# Patient Record
Sex: Female | Born: 1955
Health system: Southern US, Community
[De-identification: ages and names within clinical notes are randomized; demographics above are authoritative.]

## PROBLEM LIST (undated history)

## (undated) DIAGNOSIS — M2550 Pain in unspecified joint: Secondary | ICD-10-CM

## (undated) DIAGNOSIS — K219 Gastro-esophageal reflux disease without esophagitis: Secondary | ICD-10-CM

## (undated) DIAGNOSIS — E7800 Pure hypercholesterolemia, unspecified: Secondary | ICD-10-CM

## (undated) DIAGNOSIS — J309 Allergic rhinitis, unspecified: Secondary | ICD-10-CM

## (undated) DIAGNOSIS — C439 Malignant melanoma of skin, unspecified: Secondary | ICD-10-CM

## (undated) DIAGNOSIS — E669 Obesity, unspecified: Secondary | ICD-10-CM

## (undated) DIAGNOSIS — R5382 Chronic fatigue, unspecified: Secondary | ICD-10-CM

## (undated) DIAGNOSIS — R0602 Shortness of breath: Secondary | ICD-10-CM

## (undated) DIAGNOSIS — E079 Disorder of thyroid, unspecified: Secondary | ICD-10-CM

## (undated) DIAGNOSIS — D351 Benign neoplasm of parathyroid gland: Secondary | ICD-10-CM

## (undated) DIAGNOSIS — E785 Hyperlipidemia, unspecified: Secondary | ICD-10-CM

## (undated) DIAGNOSIS — I5189 Other ill-defined heart diseases: Secondary | ICD-10-CM

## (undated) DIAGNOSIS — Q828 Other specified congenital malformations of skin: Secondary | ICD-10-CM

## (undated) HISTORY — DX: Hyperlipidemia, unspecified: E78.5

## (undated) HISTORY — DX: Pure hypercholesterolemia, unspecified: E78.00

## (undated) HISTORY — DX: Allergic rhinitis, unspecified: J30.9

## (undated) HISTORY — PX: OTHER SURGICAL HISTORY: SHX169

## (undated) HISTORY — PX: ABDOMINAL HYSTERECTOMY: SHX81

## (undated) HISTORY — DX: Shortness of breath: R06.02

## (undated) HISTORY — DX: Gastro-esophageal reflux disease without esophagitis: K21.9

## (undated) HISTORY — DX: Chronic fatigue, unspecified: R53.82

## (undated) HISTORY — DX: Benign neoplasm of parathyroid gland: D35.1

## (undated) HISTORY — DX: Other ill-defined heart diseases: I51.89

## (undated) HISTORY — DX: Obesity, unspecified: E66.9

## (undated) HISTORY — DX: Pain in unspecified joint: M25.50

## (undated) HISTORY — PX: SINUS SURGERY WITH INSTATRAK: SHX5215

## (undated) HISTORY — DX: Other specified congenital malformations of skin: Q82.8

## (undated) HISTORY — PX: BREAST CYST ASPIRATION: SHX578

---

## 2004-07-29 ENCOUNTER — Ambulatory Visit: Payer: Self-pay

## 2005-08-01 ENCOUNTER — Ambulatory Visit: Payer: Self-pay

## 2006-08-03 ENCOUNTER — Ambulatory Visit: Payer: Self-pay

## 2007-10-23 ENCOUNTER — Ambulatory Visit: Payer: Self-pay

## 2008-10-28 ENCOUNTER — Ambulatory Visit: Payer: Self-pay

## 2009-11-02 ENCOUNTER — Ambulatory Visit: Payer: Self-pay | Admitting: Gastroenterology

## 2009-11-02 HISTORY — PX: COLONOSCOPY: SHX174

## 2009-11-02 LAB — HM COLONOSCOPY

## 2009-11-17 ENCOUNTER — Ambulatory Visit: Payer: Self-pay

## 2010-11-18 ENCOUNTER — Ambulatory Visit: Payer: Self-pay

## 2011-08-12 ENCOUNTER — Ambulatory Visit: Payer: Self-pay | Admitting: Otolaryngology

## 2012-01-24 ENCOUNTER — Ambulatory Visit: Payer: Self-pay

## 2013-02-05 ENCOUNTER — Ambulatory Visit: Payer: Self-pay

## 2014-02-19 DIAGNOSIS — E7800 Pure hypercholesterolemia, unspecified: Secondary | ICD-10-CM | POA: Insufficient documentation

## 2014-02-19 HISTORY — DX: Pure hypercholesterolemia, unspecified: E78.00

## 2014-03-05 ENCOUNTER — Ambulatory Visit: Payer: Self-pay

## 2014-10-24 DIAGNOSIS — C439 Malignant melanoma of skin, unspecified: Secondary | ICD-10-CM

## 2014-10-24 HISTORY — DX: Malignant melanoma of skin, unspecified: C43.9

## 2016-01-25 ENCOUNTER — Other Ambulatory Visit: Payer: Self-pay | Admitting: Family Medicine

## 2016-01-25 DIAGNOSIS — R918 Other nonspecific abnormal finding of lung field: Secondary | ICD-10-CM

## 2016-01-25 DIAGNOSIS — R05 Cough: Secondary | ICD-10-CM

## 2016-01-25 DIAGNOSIS — R053 Chronic cough: Secondary | ICD-10-CM

## 2016-01-28 ENCOUNTER — Ambulatory Visit
Admission: RE | Admit: 2016-01-28 | Discharge: 2016-01-28 | Disposition: A | Discharge status: Home / Self Care | Payer: 59 | Source: Ambulatory Visit | Attending: Family Medicine | Admitting: Family Medicine

## 2016-01-28 DIAGNOSIS — R053 Chronic cough: Secondary | ICD-10-CM

## 2016-01-28 DIAGNOSIS — R05 Cough: Secondary | ICD-10-CM | POA: Diagnosis present

## 2016-01-28 DIAGNOSIS — R918 Other nonspecific abnormal finding of lung field: Secondary | ICD-10-CM | POA: Diagnosis present

## 2016-02-02 ENCOUNTER — Institutional Professional Consult (permissible substitution): Payer: 59 | Admitting: Internal Medicine

## 2016-02-03 ENCOUNTER — Ambulatory Visit (INDEPENDENT_AMBULATORY_CARE_PROVIDER_SITE_OTHER): Payer: 59 | Admitting: Internal Medicine

## 2016-02-03 ENCOUNTER — Encounter: Payer: Self-pay | Admitting: Internal Medicine

## 2016-02-03 VITALS — BP 124/72 | HR 74 | Ht 64.0 in | Wt 212.0 lb

## 2016-02-03 DIAGNOSIS — R0600 Dyspnea, unspecified: Secondary | ICD-10-CM | POA: Diagnosis not present

## 2016-02-03 DIAGNOSIS — R609 Edema, unspecified: Secondary | ICD-10-CM | POA: Diagnosis not present

## 2016-02-03 DIAGNOSIS — R05 Cough: Secondary | ICD-10-CM | POA: Insufficient documentation

## 2016-02-03 DIAGNOSIS — R059 Cough, unspecified: Secondary | ICD-10-CM | POA: Insufficient documentation

## 2016-02-03 DIAGNOSIS — R600 Localized edema: Secondary | ICD-10-CM

## 2016-02-03 DIAGNOSIS — J309 Allergic rhinitis, unspecified: Secondary | ICD-10-CM

## 2016-02-03 DIAGNOSIS — K219 Gastro-esophageal reflux disease without esophagitis: Secondary | ICD-10-CM

## 2016-02-03 MED ORDER — ALBUTEROL SULFATE HFA 108 (90 BASE) MCG/ACT IN AERS: 2.0000 | INHALATION_SPRAY | RESPIRATORY_TRACT | Status: DC | PRN

## 2016-02-03 NOTE — Patient Instructions (Signed)

## 2016-02-03 NOTE — Progress Notes (Signed)
[facility] Consultation      Date: 02/03/2016,   MRN# [id number] Diane Vasquez [birth year] Code Status:  Code Status History    This patient does not have a recorded code status. Please follow your organizational policy for patients in this situation.     Hosp day:@LENGTHOFSTAYDAYS @ Referring MD: @ATDPROV @     PCP:      AdmissionWeight: 212 lb (96.163 kg)                 CurrentWeight: 212 lb (96.163 kg) Diane Vasquez is a 60 y.o. old female seen in consultation for COUGH at the request of Dr. Kandice Robinsons     CHIEF COMPLAINT:  Cough for months    HISTORY OF PRESENT ILLNESS  60 yo pleasant white female seen today for evaluation for cough she has been coughing for 4 months, she had cold like symptoms in end of November and has been coughing since then daily for last 4 months It is a nonproductive cough, associated with occasional SOB, also has fatigue and generalized weakness  She had CT chest which does not show any impressive changes beside some slight atalectasis  She has a Rash that is being treated by Derm with Dapsone daily for last 3 months-rash has disappeared since therapy Was under breasts and groin area  She has occasional hand and feet swelling when she wakes up     PAST MEDICAL HISTORY   Past Medical History  Diagnosis Date  . Allergic rhinitis   . Hyperlipidemia   . GERD (gastroesophageal reflux disease)      SURGICAL HISTORY   Past Surgical History  Procedure Laterality Date  . Sinus surgery with instatrak    . Abdominal hysterectomy       FAMILY HISTORY   Family History  Problem Relation Age of Onset  . Heart attack Mother   . Heart attack Maternal Grandfather      SOCIAL HISTORY   Social History  Substance Use Topics  . Smoking status: Never Smoker   . Smokeless tobacco: None  . Alcohol Use: Yes     Comment: occ     MEDICATIONS    Home Medication:  Current Outpatient Rx  Name  Route  Sig  Dispense   Refill  . cetirizine-pseudoephedrine (ZYRTEC-D) 5-120 MG tablet   Oral   Take 1 tablet by mouth daily.         . dapsone 100 MG tablet   Oral   Take by mouth. Pt taking 0.5 tab         . folic acid (FOLVITE) 1 MG tablet               . Ivermectin (SOOLANTRA) 1 % CREA               . omeprazole (PRILOSEC) 20 MG capsule   Oral   Take by mouth.         . tretinoin (RETIN-A) 0.025 % cream   Topical   Apply topically.         Marland Kitchen albuterol (PROVENTIL HFA;VENTOLIN HFA) 108 (90 Base) MCG/ACT inhaler   Inhalation   Inhale 2 puffs into the lungs every 4 (four) hours as needed for wheezing or shortness of breath.   1 Inhaler   10     Current Medication:  Current outpatient prescriptions:  .  cetirizine-pseudoephedrine (ZYRTEC-D) 5-120 MG tablet, Take 1 tablet by mouth daily., Disp: , Rfl:  .  dapsone 100 MG tablet,  Take by mouth. Pt taking 0.5 tab, Disp: , Rfl:  .  folic acid (FOLVITE) 1 MG tablet, , Disp: , Rfl:  .  Ivermectin (SOOLANTRA) 1 % CREA, , Disp: , Rfl:  .  omeprazole (PRILOSEC) 20 MG capsule, Take by mouth., Disp: , Rfl:  .  tretinoin (RETIN-A) 0.025 % cream, Apply topically., Disp: , Rfl:  .  albuterol (PROVENTIL HFA;VENTOLIN HFA) 108 (90 Base) MCG/ACT inhaler, Inhale 2 puffs into the lungs every 4 (four) hours as needed for wheezing or shortness of breath., Disp: 1 Inhaler, Rfl: 10    ALLERGIES   Tramadol     REVIEW OF SYSTEMS   Review of Systems  Constitutional: Negative for fever, chills, weight loss and malaise/fatigue.  HENT: Positive for congestion. Negative for hearing loss.   Eyes: Negative for blurred vision and double vision.  Respiratory: Positive for cough and shortness of breath. Negative for hemoptysis, sputum production and wheezing.   Cardiovascular: Positive for leg swelling. Negative for chest pain, palpitations and orthopnea.  Gastrointestinal: Negative for heartburn, nausea, vomiting, abdominal pain and diarrhea.    Genitourinary: Negative for dysuria and urgency.  Musculoskeletal: Positive for myalgias and joint pain. Negative for back pain and neck pain.  Skin: Negative for rash.  Neurological: Negative for dizziness, tingling, tremors and headaches.  Endo/Heme/Allergies: Does not bruise/bleed easily.  Psychiatric/Behavioral: Negative for depression, suicidal ideas and substance abuse. The patient is not nervous/anxious.   All other systems reviewed and are negative.    VS: BP 124/72 mmHg  Pulse 74  Ht 5\' 4"  (1.626 m)  Wt 212 lb (96.163 kg)  BMI 36.37 kg/m2  SpO2 100%     PHYSICAL EXAM  Physical Exam  Constitutional: She is oriented to person, place, and time. She appears well-developed and well-nourished. No distress.  HENT:  Head: Normocephalic and atraumatic.  Mouth/Throat: No oropharyngeal exudate.  Eyes: EOM are normal. Pupils are equal, round, and reactive to light. No scleral icterus.  Neck: Normal range of motion. Neck supple.  Cardiovascular: Normal rate, regular rhythm and normal heart sounds.   No murmur heard. Pulmonary/Chest: No stridor. No respiratory distress. She has no wheezes.  Abdominal: Soft. Bowel sounds are normal. She exhibits no distension. There is no tenderness. There is no rebound.  Musculoskeletal: Normal range of motion. She exhibits no edema.  Neurological: She is alert and oriented to person, place, and time. No cranial nerve deficit.  Skin: Skin is warm. She is not diaphoretic.  Psychiatric: She has a normal mood and affect.            IMAGING    Ct Chest Wo Contrast  01/29/2016  CLINICAL DATA:  Cough for 4 months EXAM: CT CHEST WITHOUT CONTRAST TECHNIQUE: Multidetector CT imaging of the chest was performed following the standard protocol without IV contrast. COMPARISON:  January 22, 2016 chest radiograph FINDINGS: Mediastinum/Lymph Nodes: Visualized thyroid appears normal. There is no appreciable thoracic adenopathy. Pericardium is not thickened.  There is no demonstrable thoracic aortic aneurysm. The visualized great vessels appear unremarkable on this noncontrast enhanced study. Lungs/Pleura: There is mild patchy atelectasis in the lower lobes, slightly more on the right than on the left. There is a ground-glass type opacity in the superior segment of the right lower lobe measuring 1.0 x 0.8 cm seen on axial slice 61 series 3. A ground-glass type opacity also in the superior segment of the right lower lobe is seen on axial slice 69 series 3 measuring 7 x 7 mm. 8  third ground-glass type opacity in the superior segment right lower lobe measures 1.0 x 0.6 cm seen on axial slice 73 series 6. Several other areas of ground-glass type opacity appear quite ill-defined and may represent resolving areas of pneumonia. An area of ground-glass opacity in the medial segment of the right lower lobe measures 1.0 x 0.8 cm seen on axial slice 59 series 3. Lungs elsewhere are clear. Upper abdomen: Visualized upper abdominal structures otherwise appear normal. Musculoskeletal: There are no blastic or lytic bone lesions. IMPRESSION: No airspace consolidation is seen. Areas of mild atelectatic change are present. There are areas of ground-glass opacity in the superior and medial segments of the right lower lobe, largest measuring 10 x 8 mm. These areas may represent residua of recent pneumonia or atelectasis. As atypical neoplasm can potentially present in this manner, further surveillance remains warranted. Non-contrast chest CT at 3-6 months is recommended. If nodules persist, subsequent management will be based upon the most suspicious nodule(s). This recommendation follows the consensus statement: Guidelines for Management of Incidental Pulmonary Nodules Detected on CT Images:From the Fleischner Society 2017; published online before print (10.1148/radiol.IJ:2314499). No adenopathy. Electronically Signed   By: Lowella Grip III M.D.   On: 01/29/2016 08:19    Ct CHEST  ON 4/6 IMAGES REVIEWED 02/03/2016    ASSESSMENT/PLAN  61 YO WHITE FEMALE SEEN TODAY FOR CHRONIC COUGH ETIOLOGY UNCLEAR BUT SHE MAY HAVE UNDERLYING REACTIVE AIRWAYS DISEASE OR EOSINOPHIL BRONCHITIS OR COPD IN THE SETTING OF PREVIOUS SECOND HAND SMOKING EXPOSURE  1.PATIENT WILL NEED PFT'S 2.WILL PRESCRIBE ALBUTEROL INHALER FOR NOW AND THEN WILL ADD INHALED STEROIDS AND ANTITUSSIVE MEDS IF NEEDED 3.WILL OBTAIN ECHO TO ASSESS CARDIAC FUNCTION AS SHE HAS OCCASIONAL LEG SWELLING 4.SHE WILL NEED RHEUMATOLOGY REFERRAL 5.CONTINUE ZYRTEC DAILY 6.CONTIUE PPI DAILY 7.repeat CT chest in 6 months for interval changes     I have personally obtained a history, examined the patient, evaluated laboratory and independently reviewed imaging results, formulated the assessment and plan and placed orders.  The Patient requires high complexity decision making for assessment and support, frequent evaluation and titration of therapies, application of advanced monitoring technologies and extensive interpretation of multiple databases.   Patient satisfied with Plan of action and management. All questions answered  Corrin Parker, M.D.  [facility]  Medical Director [place] Director [facility] Cardio-Pulmonary Department

## 2016-02-16 DIAGNOSIS — Q828 Other specified congenital malformations of skin: Secondary | ICD-10-CM | POA: Insufficient documentation

## 2016-02-16 DIAGNOSIS — M2550 Pain in unspecified joint: Secondary | ICD-10-CM

## 2016-02-16 DIAGNOSIS — R5382 Chronic fatigue, unspecified: Secondary | ICD-10-CM

## 2016-02-16 HISTORY — DX: Other specified congenital malformations of skin: Q82.8

## 2016-02-16 HISTORY — DX: Chronic fatigue, unspecified: R53.82

## 2016-02-16 HISTORY — DX: Pain in unspecified joint: M25.50

## 2016-02-18 ENCOUNTER — Ambulatory Visit (INDEPENDENT_AMBULATORY_CARE_PROVIDER_SITE_OTHER): Payer: 59

## 2016-02-18 ENCOUNTER — Other Ambulatory Visit: Payer: Self-pay

## 2016-02-18 ENCOUNTER — Telehealth: Payer: Self-pay | Admitting: *Deleted

## 2016-02-18 DIAGNOSIS — R0600 Dyspnea, unspecified: Secondary | ICD-10-CM

## 2016-02-18 NOTE — Telephone Encounter (Signed)
Pt states the ENT wants her to take Prednisone x 12days. She is scheduled for a SMW and PFT on May 11th which would be around the time she finishes the Prednisone. She is asking if she should reschedule these appts with the f/u with you or still do it even though she will just be finishing the Prednisone? Please advise.

## 2016-02-18 NOTE — Telephone Encounter (Signed)
Keep appointment and PFT

## 2016-02-19 NOTE — Telephone Encounter (Signed)
Pt informed. Nothing further needed. 

## 2016-03-01 ENCOUNTER — Institutional Professional Consult (permissible substitution): Payer: 59 | Admitting: Internal Medicine

## 2016-03-03 ENCOUNTER — Ambulatory Visit: Payer: 59

## 2016-03-04 ENCOUNTER — Ambulatory Visit (INDEPENDENT_AMBULATORY_CARE_PROVIDER_SITE_OTHER): Payer: 59 | Admitting: *Deleted

## 2016-03-04 ENCOUNTER — Ambulatory Visit (INDEPENDENT_AMBULATORY_CARE_PROVIDER_SITE_OTHER): Payer: 59 | Admitting: Internal Medicine

## 2016-03-04 ENCOUNTER — Telehealth: Payer: Self-pay | Admitting: Internal Medicine

## 2016-03-04 ENCOUNTER — Encounter: Payer: Self-pay | Admitting: Internal Medicine

## 2016-03-04 VITALS — BP 122/80 | HR 84 | Ht 64.0 in | Wt 208.0 lb

## 2016-03-04 DIAGNOSIS — R0600 Dyspnea, unspecified: Secondary | ICD-10-CM | POA: Diagnosis not present

## 2016-03-04 DIAGNOSIS — R918 Other nonspecific abnormal finding of lung field: Secondary | ICD-10-CM

## 2016-03-04 DIAGNOSIS — I5032 Chronic diastolic (congestive) heart failure: Secondary | ICD-10-CM | POA: Diagnosis not present

## 2016-03-04 LAB — PULMONARY FUNCTION TEST
DL/VA % PRED: 86 %
DL/VA: 4.16 ml/min/mmHg/L
DLCO UNC % PRED: 113 %
DLCO UNC: 27.53 ml/min/mmHg
FEF 25-75 POST: 2.93 L/s
FEF 25-75 Pre: 2.76 L/sec
FEF2575-%Change-Post: 6 %
FEF2575-%PRED-POST: 119 %
FEF2575-%Pred-Pre: 112 %
FEV1-%Change-Post: 2 %
FEV1-%PRED-PRE: 90 %
FEV1-%Pred-Post: 92 %
FEV1-Post: 2.43 L
FEV1-Pre: 2.38 L
FEV1FVC-%CHANGE-POST: 1 %
FEV1FVC-%PRED-PRE: 107 %
FEV6-%Change-Post: 0 %
FEV6-%Pred-Post: 86 %
FEV6-%Pred-Pre: 85 %
FEV6-POST: 2.82 L
FEV6-Pre: 2.8 L
FEV6FVC-%PRED-POST: 103 %
FEV6FVC-%Pred-Pre: 103 %
FVC-%Change-Post: 0 %
FVC-%PRED-POST: 83 %
FVC-%PRED-PRE: 83 %
FVC-POST: 2.82 L
FVC-PRE: 2.8 L
POST FEV1/FVC RATIO: 86 %
PRE FEV1/FVC RATIO: 85 %
Post FEV6/FVC ratio: 100 %
Pre FEV6/FVC Ratio: 100 %
RV % pred: 130 %
RV: 2.53 L
TLC % pred: 109 %
TLC: 5.52 L

## 2016-03-04 NOTE — Telephone Encounter (Signed)
Patient calling regarding her appt for a follow up CT w/ contrast. Please call patient.

## 2016-03-04 NOTE — Patient Instructions (Signed)
Cardiology Referral for Grade 1 Diastolic Dysfunction

## 2016-03-04 NOTE — Progress Notes (Signed)
SMW performed today. 

## 2016-03-04 NOTE — Addendum Note (Signed)
Addended by: Oscar La R on: 03/04/2016 02:26 PM   Modules accepted: Orders

## 2016-03-04 NOTE — Progress Notes (Signed)
[place] [facility] Consultation      Date: 03/04/2016,   MRN# [id number] Diane Vasquez 1955-12-15 Code Status:  Code Status History    This patient does not have a recorded code status. Please follow your organizational policy for patients in this situation.     Hosp day:@LENGTHOFSTAYDAYS @ Referring MD: @ATDPROV @     PCP:      AdmissionWeight: 208 lb (94.348 kg)                 CurrentWeight: 208 lb (94.348 kg) Diane Vasquez is a 60 y.o. old female seen in consultation for COUGH at the request of Dr. Kandice Robinsons     CHIEF COMPLAINT:  Cough resolved    HISTORY OF PRESENT ILLNESS  Patient here for follow up test results and follow up cough PFT's WNL 6MWT WNL  ECHO shows grade 1 diastolic dysfunction  Results reviewed and discussed with patient  Patient has no acute issues, no signs of infection at this time  Rheumatology referral - no acute process according to patient   Current Medication:  Current outpatient prescriptions:  .  albuterol (PROVENTIL HFA;VENTOLIN HFA) 108 (90 Base) MCG/ACT inhaler, Inhale 2 puffs into the lungs every 4 (four) hours as needed for wheezing or shortness of breath., Disp: 1 Inhaler, Rfl: 10 .  cetirizine-pseudoephedrine (ZYRTEC-D) 5-120 MG tablet, Take 1 tablet by mouth daily., Disp: , Rfl:  .  dapsone 100 MG tablet, Take by mouth. Pt taking 0.5 tab, Disp: , Rfl:  .  folic acid (FOLVITE) 1 MG tablet, , Disp: , Rfl:  .  Ivermectin (SOOLANTRA) 1 % CREA, , Disp: , Rfl:  .  omeprazole (PRILOSEC) 20 MG capsule, Take by mouth., Disp: , Rfl:  .  tretinoin (RETIN-A) 0.025 % cream, Apply topically., Disp: , Rfl:     ALLERGIES   Tramadol     REVIEW OF SYSTEMS   Review of Systems  Constitutional: Negative for fever, chills, weight loss and malaise/fatigue.  HENT: Negative for congestion and hearing loss.   Eyes: Negative for blurred vision and double vision.  Respiratory: Negative for cough, hemoptysis, sputum production,  shortness of breath and wheezing.   Cardiovascular: Negative for chest pain, palpitations, orthopnea and leg swelling.  Musculoskeletal: Positive for joint pain.  Skin: Negative for rash.  Neurological: Negative for headaches.  All other systems reviewed and are negative.    VS: BP 122/80 mmHg  Pulse 84  Ht 5\' 4"  (1.626 m)  Wt 208 lb (94.348 kg)  BMI 35.69 kg/m2  SpO2 95%     PHYSICAL EXAM  Physical Exam  Constitutional: She is oriented to person, place, and time. She appears well-developed and well-nourished.  HENT:  Mouth/Throat: No oropharyngeal exudate.  Eyes: EOM are normal.  Cardiovascular: Normal rate, regular rhythm and normal heart sounds.   No murmur heard. Pulmonary/Chest: Effort normal and breath sounds normal. No stridor. No respiratory distress. She has no wheezes.  Musculoskeletal: Normal range of motion. She exhibits no edema.  Neurological: She is alert and oriented to person, place, and time. No cranial nerve deficit.  Skin: Skin is warm.  Psychiatric: She has a normal mood and affect.         ASSESSMENT/PLAN  60 YO WHITE FEMALE SEEN TODAY FOR CHRONIC COUGH -which has reolved. All Pulomonary testing are WNL  1.CONTINUE ZYRTEC DAILY 2.CONTIUE PPI DAILY 3.cardiology referral for Grade 1 Diastolic dysfunction as per report   Follow up in 1 year as needed  The Patient  requires high complexity decision making for assessment and support, frequent evaluation and titration of therapies, application of advanced monitoring technologies and extensive interpretation of multiple databases.   Patient satisfied with Plan of action and management. All questions answered  Corrin Parker, M.D.  [facility]  Medical Director [place] Director [facility] Cardio-Pulmonary Department

## 2016-03-04 NOTE — Telephone Encounter (Signed)
Informed pt she is to have another CT chest in 6 months. Nothing further needed.

## 2016-03-04 NOTE — Progress Notes (Signed)
PFT performed today. 

## 2016-03-16 ENCOUNTER — Encounter: Payer: Self-pay | Admitting: Cardiology

## 2016-03-16 ENCOUNTER — Ambulatory Visit (INDEPENDENT_AMBULATORY_CARE_PROVIDER_SITE_OTHER): Payer: 59 | Admitting: Cardiology

## 2016-03-16 VITALS — BP 122/66 | HR 80 | Ht 64.0 in | Wt 209.0 lb

## 2016-03-16 DIAGNOSIS — I519 Heart disease, unspecified: Secondary | ICD-10-CM | POA: Diagnosis not present

## 2016-03-16 DIAGNOSIS — I5189 Other ill-defined heart diseases: Secondary | ICD-10-CM

## 2016-03-16 DIAGNOSIS — R0602 Shortness of breath: Secondary | ICD-10-CM

## 2016-03-16 DIAGNOSIS — R059 Cough, unspecified: Secondary | ICD-10-CM

## 2016-03-16 DIAGNOSIS — R05 Cough: Secondary | ICD-10-CM | POA: Diagnosis not present

## 2016-03-16 NOTE — Progress Notes (Signed)
PCP: Sherrin Daisy, MD  Referring Physician: Dr.  Maretta Bees Shanon Brow) [facility] - Pulmonology  Clinic Note: Chief Complaint  Patient presents with  . Shortness of Breath    Ref by Dr. Mortimer Fries for shortness of breath. Pt. had an Echo and needs the results. Meds reviewed by the patient verbally.   . Cough    HPI: Diane Vasquez is a 60 y.o. female with a PMH below who presents today for Cardiology Consultation for dyspnea  With the diagnosis of diastolic dysfunction noted on an Echocardiogram.  Recent Hospitalizations: n/a  Studies Reviewed:   2 D Echo 01/2016:  - Left ventricle: The cavity size was normal. Systolic function was normal. The estimated ejection fraction was in the range of 60% to 65%. Wall motion was normal; there were no regional wall motion abnormalities. Doppler parameters are consistent with abnormal left ventricular relaxation (grade 1 diastolic dysfunction). - Mitral valve: There was mild regurgitation. - Left atrium: The atrium was normal in size. - Right ventricle: Systolic function was normal.   CT chest 01/28/2016: No airspace consolidation. Areas of mild atelectatic change. Areas of groundglass opacity in the superior and medial segments of the right lower lobe may represent residua of recent pneumonia or atelectasis.  -- Plan is for follow-up CT scan in 3-6 months. PFTs were normal. 6 minute walk was normal.  Interval History: Diane Vasquez was referred to Dr. Mortimer Fries from pulmonary medicine for chronic cough. Apparently this began in December timeframe. Interestingly this was roughly same time she started taking dapsone for a skin condition (while it is doing a great job keeping the skin condition under control, she is noted some interesting side effects and starting a). She has noted persistent chronic cough, that according to pulmonary began with cold-like symptoms back in November 2016. She's been having a nonproductive cough with occasional dyspnea ever since. As result  she's not sleeping well and has been quite tired and fatigued. She was evaluated with PFTs, 6 minute walk and is CT scan of the chest. He also ordered an echocardiogram because of occasional leg and hand swelling.  As far cardiac symptoms:  she denies any chest tightness or pressure with rest or exertion. Some discomfort in her chest when she is coughing. She has had some exertional dyspnea but has not really been exercising ever since the onset of her coughing episodes. She doesn't get short of breath with routine activity or even with mild exertion.  She denies any significant heart failure symptoms such as PND or orthopnea but does have some swelling in her feet and hands that she also indicates began when she started taking dapsone.  While she may notice some rare "flip-flopping" in her chest, she denies any true palpitations, lightheadedness, dizziness, weakness or syncope/near syncope. No TIA/amaurosis fugax symptoms.  ROS: A comprehensive was performed. Review of Systems  Constitutional: Negative for fever, chills and malaise/fatigue (Just that she is tired from not sleeping well secondary to cough).  HENT: Negative for nosebleeds.   Respiratory: Positive for cough and shortness of breath (Minimal, exertional). Negative for hemoptysis, sputum production and wheezing.   Cardiovascular: Positive for leg swelling (More in the hands and feet then just pedal edema). Negative for chest pain, palpitations, orthopnea, claudication and PND.  Gastrointestinal: Negative for blood in stool and melena.  Genitourinary: Negative for hematuria.  Musculoskeletal: Positive for joint pain (Mostly in her hands). Negative for myalgias.  Skin:       Her rash/skin condition seems to  be improving on dapsone. She has reduced from 200 mg twice a day to 100 mg twice a day and hopes to reduce down to 50 twice a day. However she does in the past reduce her dose, the rash returns.  Neurological: Negative for dizziness,  weakness and headaches.  Endo/Heme/Allergies: Does not bruise/bleed easily.  Psychiatric/Behavioral: Negative for depression and memory loss. The patient is not nervous/anxious and does not have insomnia.   All other systems reviewed and are negative.   Past Medical History  Diagnosis Date  . Allergic rhinitis   . Hyperlipidemia   . GERD (gastroesophageal reflux disease)     Past Surgical History  Procedure Laterality Date  . Sinus surgery with instatrak    . Abdominal hysterectomy     Prior to Admission medications   Medication Sig Start Date End Date Taking? Authorizing Provider  albuterol (PROVENTIL HFA;VENTOLIN HFA) 108 (90 Base) MCG/ACT inhaler Inhale 2 puffs into the lungs every 4 (four) hours as needed for wheezing or shortness of breath. 02/03/16  Yes Flora Lipps, MD  cetirizine-pseudoephedrine (ZYRTEC-D) 5-120 MG tablet Take 1 tablet by mouth daily.   Yes Historical Provider, MD  dapsone 100 MG tablet Take by mouth. Pt taking 0.5 tab 01/13/16  Yes Historical Provider, MD  folic acid (FOLVITE) 1 MG tablet  01/18/16  Yes Historical Provider, MD  metroNIDAZOLE (METROGEL) 1 % gel Apply topically daily.   Yes Historical Provider, MD  omeprazole (PRILOSEC) 20 MG capsule Take by mouth. 10/10/14  Yes Historical Provider, MD  tretinoin (RETIN-A) 0.025 % cream Apply topically.   Yes Historical Provider, MD    Allergies  Allergen Reactions  . Tramadol     Social History   Social History  . Marital Status: Divorced    Spouse Name: N/A  . Number of Children: N/A  . Years of Education: N/A   Social History Main Topics  . Smoking status: Never Smoker   . Smokeless tobacco: None  . Alcohol Use: Yes     Comment: occ  . Drug Use: No  . Sexual Activity: Not Asked   Other Topics Concern  . None   Social History Narrative   Family History family history includes Heart attack in her maternal grandfather and mother.   Wt Readings from Last 3 Encounters:  03/16/16 209 lb  (94.802 kg)  03/04/16 208 lb (94.348 kg)  02/03/16 212 lb (96.163 kg)    PHYSICAL EXAM BP 122/66 mmHg  Pulse 80  Ht 5\' 4"  (1.626 m)  Wt 209 lb (94.802 kg)  BMI 35.86 kg/m2  SpO2 94% General appearance: alert, cooperative, appears stated age, no distress and Moderately obese HEENT: [place]/AT, EOMI, MMM, anicteric sclera Neck: no adenopathy, no carotid bruit and no JVD Lungs: clear to auscultation bilaterally, normal percussion bilaterally and non-labored Heart: regular rate and rhythm, S1 and S2 normal, no murmur, click, rub or gallop; nondisplaced PMI Abdomen: soft, non-tender; bowel sounds normal; no masses,  no organomegaly; no HJR Extremities: extremities normal, atraumatic, no cyanosis, and edema is trivial at best Pulses: 2+ and symmetric;  Skin: mobility and turgor normal, no abnormal pigmentation, no edema, no evidence of bleeding or bruising and no lesions noted or  Neurologic: Mental status: Alert, oriented, thought content appropriate Cranial nerves: normal (II-XII grossly intact)    Adult ECG Report  Rate: 80 ;  Rhythm: normal sinus rhythm and Normal axis, intervals and durations. Low voltage.;   Narrative Interpretation: Normal EKG   Other studies Reviewed: Additional studies/  records that were reviewed today include:  Recent Labs: n/a   ASSESSMENT / PLAN: Problem List Items Addressed This Visit    SOB (shortness of breath) - Primary    She really doesn't notice that much shortness of breath. Only relief she exerts herself, and this simply could just be related to deconditioning. There could be some component of diastolic dysfunction leading to this, but with normal blood pressures, there is no real urgent treatment options.      Relevant Orders   EKG 12-Lead (Completed)   Diastolic dysfunction without heart failure    Grade 1 diastolic dysfunction and 60 year old woman is probably not that far out of the norm. Grade 1 diastolic dysfunction in the absence of  significant hypertension may very well be an over read of the original echo, and may certainly be "normal for age ". The fact that she denies any significant exertional dyspnea, PND orthopnea was suggested this is not leading to any heart failure symptoms. As a result, it is unlikely that this is the etiology for her chronic cough. Close monitoring of blood pressure and symptoms of orthopnea and PND would be recommended to determine if this is actually becoming symptomatic.  For now I don't feel that any evaluation is required.      Cough    Looking at the evaluation of her cough, more likely suggestion of what could be causing the cough would be findings noted on the CT scan. Question if this is a potential reaction to dapsone.         Current medicines are reviewed at length with the patient today. (+/- concerns) Questions about dapsone The following changes have been made: None   Studies Ordered:   Orders Placed This Encounter  Procedures  . EKG 12-Lead      Glenetta Hew, M.D., M.S. Interventional Cardiologist   Pager # [phone] Phone # [phone] [address] [place], [place] [address]

## 2016-03-16 NOTE — Patient Instructions (Signed)
Medication Instructions:  Your physician recommends that you continue on your current medications as directed. Please refer to the Current Medication list given to you today.   Labwork: none  Testing/Procedures: none  Follow-Up: Your physician recommends that you schedule a follow-up appointment as needed with Dr. Harding   Any Other Special Instructions Will Be Listed Below (If Applicable).     If you need a refill on your cardiac medications before your next appointment, please call your pharmacy.   

## 2016-03-17 ENCOUNTER — Encounter: Payer: Self-pay | Admitting: Cardiology

## 2016-03-17 DIAGNOSIS — I5189 Other ill-defined heart diseases: Secondary | ICD-10-CM | POA: Insufficient documentation

## 2016-03-17 DIAGNOSIS — Z87898 Personal history of other specified conditions: Secondary | ICD-10-CM | POA: Insufficient documentation

## 2016-03-17 DIAGNOSIS — R0602 Shortness of breath: Secondary | ICD-10-CM

## 2016-03-17 HISTORY — DX: Other ill-defined heart diseases: I51.89

## 2016-03-17 HISTORY — DX: Shortness of breath: R06.02

## 2016-03-17 NOTE — Assessment & Plan Note (Signed)
Looking at the evaluation of her cough, more likely suggestion of what could be causing the cough would be findings noted on the CT scan. Question if this is a potential reaction to dapsone.

## 2016-03-17 NOTE — Assessment & Plan Note (Signed)
Grade 1 diastolic dysfunction and 60 year old woman is probably not that far out of the norm. Grade 1 diastolic dysfunction in the absence of significant hypertension may very well be an over read of the original echo, and may certainly be "normal for age ". The fact that she denies any significant exertional dyspnea, PND orthopnea was suggested this is not leading to any heart failure symptoms. As a result, it is unlikely that this is the etiology for her chronic cough. Close monitoring of blood pressure and symptoms of orthopnea and PND would be recommended to determine if this is actually becoming symptomatic.  For now I don't feel that any evaluation is required.

## 2016-03-17 NOTE — Assessment & Plan Note (Signed)
She really doesn't notice that much shortness of breath. Only relief she exerts herself, and this simply could just be related to deconditioning. There could be some component of diastolic dysfunction leading to this, but with normal blood pressures, there is no real urgent treatment options.

## 2016-04-13 ENCOUNTER — Other Ambulatory Visit: Payer: Self-pay | Admitting: Obstetrics and Gynecology

## 2016-04-13 DIAGNOSIS — Z1231 Encounter for screening mammogram for malignant neoplasm of breast: Secondary | ICD-10-CM

## 2016-04-27 ENCOUNTER — Other Ambulatory Visit: Payer: Self-pay | Admitting: Obstetrics and Gynecology

## 2016-04-27 ENCOUNTER — Ambulatory Visit
Admission: RE | Admit: 2016-04-27 | Discharge: 2016-04-27 | Disposition: A | Discharge status: Home / Self Care | Payer: 59 | Source: Ambulatory Visit | Attending: Obstetrics and Gynecology | Admitting: Obstetrics and Gynecology

## 2016-04-27 DIAGNOSIS — Z1231 Encounter for screening mammogram for malignant neoplasm of breast: Secondary | ICD-10-CM

## 2016-06-30 ENCOUNTER — Telehealth: Payer: Self-pay | Admitting: Internal Medicine

## 2016-06-30 DIAGNOSIS — R918 Other nonspecific abnormal finding of lung field: Secondary | ICD-10-CM

## 2016-06-30 NOTE — Telephone Encounter (Signed)
Please advise 

## 2016-06-30 NOTE — Telephone Encounter (Signed)
Pt informed and states she wants the CT chest with contrast. Order placed. Nothing further needed.

## 2016-06-30 NOTE — Telephone Encounter (Signed)
Contacted patient to scheduled F/U CT Chest and ROV in Oct. Pt asked if "Physician had ordered this CT Chest with contrast since the last CT Chest she had showed some shadows". I advised patient that the order I have indicates no contrast.   Pt wanted Dr. Mortimer Fries to know that since her last visit with him, she has had 3 Melanoma's removed-Stage 0 and is concerned that she may need contrast with this CT Chest to have a clearer image.  Please advise.  Rhonda J Cobb

## 2016-06-30 NOTE — Telephone Encounter (Signed)
What ever patient wants

## 2016-08-02 ENCOUNTER — Ambulatory Visit
Admission: RE | Admit: 2016-08-02 | Discharge: 2016-08-02 | Disposition: A | Discharge status: Home / Self Care | Payer: 59 | Source: Ambulatory Visit | Attending: Internal Medicine | Admitting: Internal Medicine

## 2016-08-02 DIAGNOSIS — R918 Other nonspecific abnormal finding of lung field: Secondary | ICD-10-CM | POA: Diagnosis not present

## 2016-08-02 MED ORDER — IOPAMIDOL (ISOVUE-370) INJECTION 76%
75.0000 mL | Freq: Once | INTRAVENOUS | Status: AC | PRN
  Administered 2016-08-02: 75 mL via INTRAVENOUS

## 2016-08-09 ENCOUNTER — Encounter: Payer: Self-pay | Admitting: Internal Medicine

## 2016-08-09 ENCOUNTER — Ambulatory Visit (INDEPENDENT_AMBULATORY_CARE_PROVIDER_SITE_OTHER): Payer: 59 | Admitting: Internal Medicine

## 2016-08-09 VITALS — BP 132/78 | HR 72 | Ht 64.0 in | Wt 212.0 lb

## 2016-08-09 DIAGNOSIS — R918 Other nonspecific abnormal finding of lung field: Secondary | ICD-10-CM

## 2016-08-09 DIAGNOSIS — Z23 Encounter for immunization: Secondary | ICD-10-CM | POA: Diagnosis not present

## 2016-08-09 NOTE — Progress Notes (Signed)
[facility] Consultation      Date: 08/09/2016,   MRN# [id number] Diane Vasquez [birth year] Code Status:  Code Status History    This patient does not have a recorded code status. Please follow your organizational policy for patients in this situation.     Hosp day:@LENGTHOFSTAYDAYS @ Referring MD: @ATDPROV @     PCP:      AdmissionWeight: 212 lb (96.2 kg)                 CurrentWeight: 212 lb (96.2 kg) Diane Vasquez is a 60 y.o. old female seen in consultation for COUGH at the request of Dr. Kandice Robinsons     CHIEF COMPLAINT:  Cough resolved Follow up CT chest    HISTORY OF PRESENT ILLNESS  Patient here for follow up test results and follow up cough PFT's WNL 6MWT WNL  CT chest shows resolution of GGO but has residual small sub centimeter RLL nodules 1. Enhancing 1.9 cm mass extending inferiorly from the posterior margin of the right thyroid lobe, stable in size for 6 months. Differential includes exophytic right thyroid lobe nodule versus a right parathyroid adenoma. Recommend correlation with serum calcium levels and thyroid ultrasound. 2. Previously described right lower lobe ground-glass opacities have resolved, consistent with resolved inflammatory opacities. 3. Two 3 mm subpleural right lower lobe pulmonary nodules, 1 of which is stable for 6 months and the other of which is new, probably Benign.  findings discussed with patient   ECHO shows grade 1 diastolic dysfunction  Results reviewed and discussed with patient  Patient has no acute issues, no signs of infection at this time  Rheumatology referral - no acute process according to patient   Current Medication:  Current Outpatient Prescriptions:  .  albuterol (PROVENTIL HFA;VENTOLIN HFA) 108 (90 Base) MCG/ACT inhaler, Inhale 2 puffs into the lungs every 4 (four) hours as needed for wheezing or shortness of breath., Disp: 1 Inhaler, Rfl: 10 .  cetirizine-pseudoephedrine (ZYRTEC-D)  5-120 MG tablet, Take 1 tablet by mouth daily., Disp: , Rfl:  .  dapsone 100 MG tablet, Take 50 mg by mouth. 1 tab, Disp: , Rfl:  .  fluocinonide ointment (LIDEX) 0.05 %, , Disp: , Rfl:  .  metroNIDAZOLE (METROGEL) 1 % gel, Apply topically daily., Disp: , Rfl:  .  omeprazole (PRILOSEC) 20 MG capsule, Take by mouth., Disp: , Rfl:  .  tretinoin (RETIN-A) 0.025 % cream, Apply topically., Disp: , Rfl:     ALLERGIES   Tramadol     REVIEW OF SYSTEMS   Review of Systems  Constitutional: Negative for chills, fever, malaise/fatigue and weight loss.  HENT: Negative for congestion and hearing loss.   Eyes: Negative for blurred vision and double vision.  Respiratory: Negative for cough, hemoptysis, sputum production, shortness of breath and wheezing.   Cardiovascular: Negative for chest pain, palpitations, orthopnea and leg swelling.  Musculoskeletal: Positive for joint pain.  Skin: Negative for rash.  Neurological: Negative for headaches.  All other systems reviewed and are negative.    VS: BP 132/78 (BP Location: Left Arm, Cuff Size: Normal)   Pulse 72   Ht 5\' 4"  (1.626 m)   Wt 212 lb (96.2 kg)   SpO2 97%   BMI 36.39 kg/m      PHYSICAL EXAM  Physical Exam  Constitutional: She is oriented to person, place, and time. She appears well-developed and well-nourished.  HENT:  Mouth/Throat: No oropharyngeal exudate.  Eyes: EOM are normal.  Cardiovascular: Normal rate,  regular rhythm and normal heart sounds.   No murmur heard. Pulmonary/Chest: Effort normal and breath sounds normal. No stridor. No respiratory distress. She has no wheezes.  Musculoskeletal: Normal range of motion. She exhibits no edema.  Neurological: She is alert and oriented to person, place, and time. No cranial nerve deficit.  Skin: Skin is warm.  Psychiatric: She has a normal mood and affect.         ASSESSMENT/PLAN  60 YO WHITE FEMALE SEEN TODAY FOR follow up CT chest. -which has reolved. All Pulmonary  testing are WNL Patient has 2 RT RLL 3 mm nodules-likely benign  1.CONTINUE ZYRTEC DAILY 2.CONTINUE PPI DAILY 3.follow up in 1 year for RLL nodules with CT chest   Follow up in 1 year as needed  The Patient requires high complexity decision making for assessment and support, frequent evaluation and titration of therapies, application of advanced monitoring technologies and extensive interpretation of multiple databases.   Patient satisfied with Plan of action and management. All questions answered  Corrin Parker, M.D.  [facility]  Medical Director [place] Director [facility] Cardio-Pulmonary Department

## 2016-08-09 NOTE — Patient Instructions (Signed)
Follow up with ENT for thyroid nodule Follow up with Dermatology as needed Follow up in 1 year with CT chest for lung nodules

## 2016-08-11 ENCOUNTER — Other Ambulatory Visit: Payer: Self-pay | Admitting: Otolaryngology

## 2016-08-11 DIAGNOSIS — E041 Nontoxic single thyroid nodule: Secondary | ICD-10-CM

## 2016-08-16 ENCOUNTER — Ambulatory Visit: Payer: 59

## 2016-09-12 ENCOUNTER — Encounter
Admission: RE | Admit: 2016-09-12 | Discharge: 2016-09-12 | Disposition: A | Discharge status: Home / Self Care | Payer: 59 | Source: Ambulatory Visit | Attending: Otolaryngology | Admitting: Otolaryngology

## 2016-09-12 NOTE — Patient Instructions (Signed)
  Your procedure is scheduled on:09/19/16 Report to Day Surgery. [facility]OND FLOOR To find out your arrival time please call [phone] between 1PM - 3PM on 09/16/16  Remember: Instructions that are not followed completely may result in serious medical risk, up to and including death, or upon the discretion of your surgeon and anesthesiologist your surgery may need to be rescheduled.    _X___ 1. Do not eat food or drink liquids after midnight. No gum chewing or hard candies.     ____ 2. No Alcohol for 24 hours before or after surgery.   ____ 3. Do Not Smoke For 24 Hours Prior to Your Surgery.   ____ 4. Bring all medications with you on the day of surgery if instructed.    __X__ 5. Notify your doctor if there is any change in your medical condition     (cold, fever, infections).       Do not wear jewelry, make-up, hairpins, clips or nail polish.  Do not wear lotions, powders, or perfumes. You may wear deodorant.  Do not shave 48 hours prior to surgery. Men may shave face and neck.  Do not bring valuables to the hospital.    [facility] is not responsible for any belongings or valuables.               Contacts, dentures or bridgework may not be worn into surgery.  Leave your suitcase in the car. After surgery it may be brought to your room.  For patients admitted to the hospital, discharge time is determined by your                treatment team.   Patients discharged the day of surgery will not be allowed to drive home.   X____ Take these medicines the morning of surgery with A SIP OF WATER:    1. OMEPRAZOLE AT BEDTIME BEDTIME 09/18/16 AND AM SURGERY  2.   3.   4.  5.  6.  ____ Fleet Enema (as directed)   ____ Use CHG Soap as directed  ____ Use inhalers on the day of surgery  ____ Stop metformin 2 days prior to surgery    ____ Take 1/2 of usual insulin dose the night before surgery and none on the morning of surgery.   ____ Stop Coumadin/Plavix/aspirin on   _X_ Stop Anti-inflammatories on            STOP BC POWDERS UNTIL AFTER SURGERY   ____ Stop supplements until after surgery.    ____ Bring C-Pap to the hospital.

## 2016-09-19 ENCOUNTER — Encounter
Admission: RE | Disposition: A | Discharge status: Home / Self Care | Payer: Self-pay | Source: Ambulatory Visit | Attending: Otolaryngology

## 2016-09-19 ENCOUNTER — Ambulatory Visit: Payer: 59 | Admitting: Anesthesiology

## 2016-09-19 ENCOUNTER — Encounter: Payer: Self-pay | Admitting: *Deleted

## 2016-09-19 ENCOUNTER — Observation Stay
Admission: RE | Admit: 2016-09-19 | Discharge: 2016-09-19 | Disposition: A | Discharge status: Home / Self Care | Payer: 59 | Source: Ambulatory Visit | Attending: Otolaryngology | Admitting: Otolaryngology

## 2016-09-19 DIAGNOSIS — D351 Benign neoplasm of parathyroid gland: Secondary | ICD-10-CM | POA: Diagnosis present

## 2016-09-19 DIAGNOSIS — E669 Obesity, unspecified: Secondary | ICD-10-CM | POA: Diagnosis not present

## 2016-09-19 DIAGNOSIS — E210 Primary hyperparathyroidism: Secondary | ICD-10-CM | POA: Insufficient documentation

## 2016-09-19 DIAGNOSIS — K219 Gastro-esophageal reflux disease without esophagitis: Secondary | ICD-10-CM | POA: Diagnosis not present

## 2016-09-19 DIAGNOSIS — Z6836 Body mass index (BMI) 36.0-36.9, adult: Secondary | ICD-10-CM | POA: Insufficient documentation

## 2016-09-19 HISTORY — PX: PARATHYROIDECTOMY: SHX19

## 2016-09-19 HISTORY — DX: Benign neoplasm of parathyroid gland: D35.1

## 2016-09-19 SURGERY — PARATHYROIDECTOMY
Anesthesia: General | Laterality: Right

## 2016-09-19 MED ORDER — FLEET ENEMA 7-19 GM/118ML RE ENEM: 1.0000 | ENEMA | Freq: Once | RECTAL | Status: DC | PRN

## 2016-09-19 MED ORDER — ROCURONIUM BROMIDE 100 MG/10ML IV SOLN
INTRAVENOUS | Status: DC | PRN
  Administered 2016-09-19: 10 mg via INTRAVENOUS

## 2016-09-19 MED ORDER — ACETAMINOPHEN 650 MG RE SUPP: 650.0000 mg | Freq: Four times a day (QID) | RECTAL | Status: DC | PRN

## 2016-09-19 MED ORDER — LIDOCAINE HCL (CARDIAC) 20 MG/ML IV SOLN
INTRAVENOUS | Status: DC | PRN
  Administered 2016-09-19: 30 mg via INTRAVENOUS

## 2016-09-19 MED ORDER — LIDOCAINE-EPINEPHRINE (PF) 1 %-1:200000 IJ SOLN
INTRAMUSCULAR | Status: AC
  Filled 2016-09-19: qty 30

## 2016-09-19 MED ORDER — MIDAZOLAM HCL 2 MG/2ML IJ SOLN
INTRAMUSCULAR | Status: DC | PRN
  Administered 2016-09-19: 2 mg via INTRAVENOUS

## 2016-09-19 MED ORDER — DEXAMETHASONE SODIUM PHOSPHATE 10 MG/ML IJ SOLN
INTRAMUSCULAR | Status: DC | PRN
  Administered 2016-09-19: 10 mg via INTRAVENOUS

## 2016-09-19 MED ORDER — MAGNESIUM HYDROXIDE 400 MG/5ML PO SUSP: 30.0000 mL | Freq: Every day | ORAL | Status: DC | PRN

## 2016-09-19 MED ORDER — LIDOCAINE-EPINEPHRINE 2 %-1:100000 IJ SOLN
INTRAMUSCULAR | Status: DC | PRN
  Administered 2016-09-19: 3 mL

## 2016-09-19 MED ORDER — OXYCODONE HCL 5 MG PO TABS: 5.0000 mg | ORAL_TABLET | Freq: Once | ORAL | Status: DC | PRN

## 2016-09-19 MED ORDER — PHENYLEPHRINE HCL 10 MG/ML IJ SOLN
INTRAMUSCULAR | Status: DC | PRN
  Administered 2016-09-19 (×2): 50 ug via INTRAVENOUS
  Administered 2016-09-19: 100 ug via INTRAVENOUS
  Administered 2016-09-19: 50 ug via INTRAVENOUS

## 2016-09-19 MED ORDER — MORPHINE SULFATE (PF) 4 MG/ML IV SOLN: 3.0000 mg | INTRAVENOUS | Status: DC | PRN

## 2016-09-19 MED ORDER — ONDANSETRON 4 MG PO TBDP: 4.0000 mg | ORAL_TABLET | Freq: Four times a day (QID) | ORAL | Status: DC | PRN

## 2016-09-19 MED ORDER — PROPOFOL 10 MG/ML IV BOLUS
INTRAVENOUS | Status: DC | PRN
  Administered 2016-09-19: 150 mg via INTRAVENOUS

## 2016-09-19 MED ORDER — ONDANSETRON HCL 4 MG/2ML IJ SOLN: 4.0000 mg | Freq: Four times a day (QID) | INTRAMUSCULAR | Status: DC | PRN

## 2016-09-19 MED ORDER — MEPERIDINE HCL 25 MG/ML IJ SOLN: 6.2500 mg | INTRAMUSCULAR | Status: DC | PRN

## 2016-09-19 MED ORDER — BISACODYL 5 MG PO TBEC: 5.0000 mg | DELAYED_RELEASE_TABLET | Freq: Every day | ORAL | Status: DC | PRN

## 2016-09-19 MED ORDER — DEXTROSE-NACL 5-0.45 % IV SOLN
INTRAVENOUS | Status: DC
  Administered 2016-09-19: 13:00:00 via INTRAVENOUS

## 2016-09-19 MED ORDER — SUCCINYLCHOLINE CHLORIDE 20 MG/ML IJ SOLN
INTRAMUSCULAR | Status: DC | PRN
  Administered 2016-09-19: 100 mg via INTRAVENOUS

## 2016-09-19 MED ORDER — ONDANSETRON HCL 4 MG/2ML IJ SOLN
INTRAMUSCULAR | Status: DC | PRN
  Administered 2016-09-19: 4 mg via INTRAVENOUS

## 2016-09-19 MED ORDER — BACITRACIN ZINC 500 UNIT/GM EX OINT
TOPICAL_OINTMENT | CUTANEOUS | Status: AC
  Filled 2016-09-19: qty 28.35

## 2016-09-19 MED ORDER — PROMETHAZINE HCL 25 MG/ML IJ SOLN: 6.2500 mg | INTRAMUSCULAR | Status: DC | PRN

## 2016-09-19 MED ORDER — LACTATED RINGERS IV SOLN
INTRAVENOUS | Status: DC
  Administered 2016-09-19: 07:00:00 via INTRAVENOUS

## 2016-09-19 MED ORDER — ACETAMINOPHEN 325 MG PO TABS
650.0000 mg | ORAL_TABLET | Freq: Four times a day (QID) | ORAL | Status: DC | PRN
Start: 2016-09-19 — End: 2016-09-19
  Administered 2016-09-19: 650 mg via ORAL
  Filled 2016-09-19: qty 2

## 2016-09-19 MED ORDER — FENTANYL CITRATE (PF) 100 MCG/2ML IJ SOLN: 25.0000 ug | INTRAMUSCULAR | Status: DC | PRN

## 2016-09-19 MED ORDER — FENTANYL CITRATE (PF) 100 MCG/2ML IJ SOLN
INTRAMUSCULAR | Status: DC | PRN
  Administered 2016-09-19: 150 ug via INTRAVENOUS
  Administered 2016-09-19: 50 ug via INTRAVENOUS

## 2016-09-19 MED ORDER — BACITRACIN 500 UNIT/GM EX OINT
TOPICAL_OINTMENT | CUTANEOUS | Status: DC | PRN
  Administered 2016-09-19: 1 via TOPICAL

## 2016-09-19 MED ORDER — HYDROCODONE-ACETAMINOPHEN 5-325 MG PO TABS: 1.0000 | ORAL_TABLET | ORAL | Status: DC | PRN

## 2016-09-19 MED ORDER — OXYCODONE HCL 5 MG/5ML PO SOLN: 5.0000 mg | Freq: Once | ORAL | Status: DC | PRN

## 2016-09-19 MED ORDER — ACETAMINOPHEN 500 MG PO TABS: 1000.0000 mg | ORAL_TABLET | Freq: Four times a day (QID) | ORAL | Status: DC | PRN

## 2016-09-19 SURGICAL SUPPLY — 36 items
BLADE SURG 15 STRL LF DISP TIS (BLADE) ×1 IMPLANT
BLADE SURG 15 STRL SS (BLADE) ×2
CANISTER SUCT 1200ML W/VALVE (MISCELLANEOUS) ×3 IMPLANT
CORD BIP STRL DISP 12FT (MISCELLANEOUS) ×3 IMPLANT
DRAIN TLS ROUND 10FR (DRAIN) IMPLANT
DRAPE MAG INST 16X20 L/F (DRAPES) ×3 IMPLANT
DRSG TEGADERM 2-3/8X2-3/4 SM (GAUZE/BANDAGES/DRESSINGS) ×6 IMPLANT
DRSG TEGADERM 6X8 (GAUZE/BANDAGES/DRESSINGS) ×3 IMPLANT
DRSG TELFA 3X8 NADH (GAUZE/BANDAGES/DRESSINGS) ×3 IMPLANT
ELECT LARYNGEAL 6/7 (MISCELLANEOUS)
ELECT LARYNGEAL 8/9 (MISCELLANEOUS) ×3
ELECT REM PT RETURN 9FT ADLT (ELECTROSURGICAL) ×3
ELECTRODE LARYNGEAL 6/7 (MISCELLANEOUS) IMPLANT
ELECTRODE LARYNGEAL 8/9 (MISCELLANEOUS) ×1 IMPLANT
ELECTRODE REM PT RTRN 9FT ADLT (ELECTROSURGICAL) ×1 IMPLANT
FORCEPS JEWEL BIP 4-3/4 STR (INSTRUMENTS) ×3 IMPLANT
GLOVE BIO SURGEON STRL SZ7.5 (GLOVE) ×3 IMPLANT
GLOVE PROTEXIS LATEX SZ 7.5 (GLOVE) ×3 IMPLANT
GOWN STRL REUS W/ TWL LRG LVL3 (GOWN DISPOSABLE) ×3 IMPLANT
GOWN STRL REUS W/TWL LRG LVL3 (GOWN DISPOSABLE) ×6
HARMONIC SCALPEL FOCUS (MISCELLANEOUS) ×3 IMPLANT
HOOK STAY BLUNT/RETRACTOR 5M (MISCELLANEOUS) IMPLANT
KIT RM TURNOVER STRD PROC AR (KITS) ×3 IMPLANT
LABEL OR SOLS (LABEL) IMPLANT
NS IRRIG 500ML POUR BTL (IV SOLUTION) ×3 IMPLANT
PACK HEAD/NECK (MISCELLANEOUS) ×3 IMPLANT
PROBE NEUROSIGN BIPOL (MISCELLANEOUS) ×1 IMPLANT
PROBE NEUROSIGN BIPOLAR (MISCELLANEOUS) ×2
SPONGE KITTNER 5P (MISCELLANEOUS) ×12 IMPLANT
SPONGE XRAY 4X4 16PLY STRL (MISCELLANEOUS) ×3 IMPLANT
SUT ETHILON 6 0 9-3 1X18 BLK (SUTURE) IMPLANT
SUT PROLENE 6 0 P 1 18 (SUTURE) IMPLANT
SUT SILK 2 0 (SUTURE) ×2
SUT SILK 2-0 18XBRD TIE 12 (SUTURE) ×1 IMPLANT
SUT VIC AB 4-0 RB1 18 (SUTURE) ×3 IMPLANT
SYSTEM CHEST DRAIN TLS 7FR (DRAIN) ×3 IMPLANT

## 2016-09-19 NOTE — Progress Notes (Signed)
Patient has soft wrap to right wrist, states she had Carpal tunnell surgery on 09-06-2016.   No iv placed in that arm or hand.

## 2016-09-19 NOTE — Discharge Summary (Signed)
Physician Discharge Summary  Patient ID: [name] MRN: [id number] DOB/AGE: 58-Feb-1957 58 y.o.  Admit date: 09/19/2016 Discharge date: 09/19/2016  Admission Diagnoses:Right inferior parathyroid adenoma  Discharge Diagnoses: Right inferior parathyroid adenoma Active Problems:   Parathyroid adenoma   Discharged Condition: good  Hospital Course: Patient had excision of a right parathyroid adenoma this morning. She has been swallowing well and feeling fine with very little discomfort. Her dressing was changed and the drain was removed as there is very little drainage. Her voice is clear and she is breathing fine. He is discharged home and followed up in the office in 1 week  Consults: None  Significant Diagnostic Studies: radiology: CT scan: CT scan of the chest showed a right inferior parathyroid adenoma  Treatments: surgery: Excision right inferior parathyroid adenoma  Discharge Exam: Blood pressure 122/63, pulse 88, temperature 97.7 F (36.5 C), resp. rate 20, SpO2 94 %. Incision/Wound: Wound is flat and no bruising. The drain is removed. Dressing is reapplied. Her voice is clear and she is breathing well. Pain is well-controlled  Disposition: To be discharged home and followed up in the office in 1 week  Discharge Instructions    Call MD for:  hives    Complete by:  As directed    Call MD for:  persistant nausea and vomiting    Complete by:  As directed    Call MD for:  redness, tenderness, or signs of infection (pain, swelling, redness, odor or green/yellow discharge around incision site)    Complete by:  As directed    Call MD for:  severe uncontrolled pain    Complete by:  As directed    Call MD for:  temperature >100.4    Complete by:  As directed    Diet general    Complete by:  As directed    Increase activity slowly    Complete by:  As directed        Medication List    TAKE these medications   acetaminophen 500 MG tablet Commonly known as:   TYLENOL Take 1,000 mg by mouth every 6 (six) hours as needed for headache.   cetirizine-pseudoephedrine 5-120 MG tablet Commonly known as:  ZYRTEC-D Take 1 tablet by mouth daily.   dapsone 100 MG tablet Take 50 mg by mouth daily.   fluocinonide ointment 0.05 % Commonly known as:  LIDEX Apply 1 application topically daily as needed (rash).   GOODY HEADACHE PO Take 1 packet by mouth daily as needed (pain).   metroNIDAZOLE 1 % gel Commonly known as:  METROGEL Apply 1 application topically daily.   omeprazole 20 MG capsule Commonly known as:  PRILOSEC Take 20 mg by mouth daily.   SOOLANTRA 1 % Crea Generic drug:  Ivermectin Apply 1 application topically at bedtime.        Signed: Courtenay Hirth H 09/19/2016, 6:21 PM

## 2016-09-19 NOTE — Transfer of Care (Signed)
Immediate Anesthesia Transfer of Care Note  Patient: Diane Vasquez  Procedure(s) Performed: Procedure(s): EXCISION PARATHYROID ADENOMA (Right)  Patient Location: PACU  Anesthesia Type:General  Level of Consciousness: lethargic and responds to stimulation  Airway & Oxygen Therapy: Patient Spontanous Breathing and Patient connected to face mask oxygen  Post-op Assessment: Report given to RN and Post -op Vital signs reviewed and stable  Post vital signs: Reviewed and stable  Last Vitals:  Vitals:   09/19/16 0616 09/19/16 0937  BP: 132/62 (!) 105/57  Pulse: 90 86  Resp: 18 16  Temp: 36.2 C 36.4 C    Last Pain:  Vitals:   09/19/16 0616  TempSrc: Oral         Complications: No apparent anesthesia complications

## 2016-09-19 NOTE — Progress Notes (Signed)
Alert and oriented. Vital signs stable . No signs of acute distress. Discharge instructions given. Patient verbalized understanding. No other issues noted at this time.    

## 2016-09-19 NOTE — H&P (Signed)
  H&P has been reviewed and no changes necessary. To be downloaded later. 

## 2016-09-19 NOTE — Anesthesia Preprocedure Evaluation (Signed)
Anesthesia Evaluation  Patient identified by MRN, date of birth, ID band Patient awake    Reviewed: Allergy & Precautions, NPO status , Patient's Chart, lab work & pertinent test results  History of Anesthesia Complications Negative for: history of anesthetic complications  Airway Mallampati: I  TM Distance: >3 FB Neck ROM: Full    Dental no notable dental hx.    Pulmonary neg pulmonary ROS, neg sleep apnea, neg COPD,    breath sounds clear to auscultation- rhonchi (-) wheezing      Cardiovascular Exercise Tolerance: Good (-) hypertension(-) CAD and (-) Past MI  Rhythm:Regular Rate:Normal - Systolic murmurs and - Diastolic murmurs    Neuro/Psych negative neurological ROS  negative psych ROS   GI/Hepatic Neg liver ROS, GERD  ,  Endo/Other  negative endocrine ROSneg diabetes  Renal/GU negative Renal ROS     Musculoskeletal negative musculoskeletal ROS (+)   Abdominal (+) + obese,   Peds  Hematology negative hematology ROS (+)   Anesthesia Other Findings Past Medical History: No date: Allergic rhinitis No date: Cancer ([place])     Comment: MELANOMA No date: GERD (gastroesophageal reflux disease) No date: Hyperlipidemia   Reproductive/Obstetrics                             Anesthesia Physical Anesthesia Plan  ASA: II  Anesthesia Plan: General   Post-op Pain Management:    Induction: Intravenous  Airway Management Planned: Oral ETT  Additional Equipment:   Intra-op Plan:   Post-operative Plan: Extubation in OR  Informed Consent: I have reviewed the patients History and Physical, chart, labs and discussed the procedure including the risks, benefits and alternatives for the proposed anesthesia with the patient or authorized representative who has indicated his/her understanding and acceptance.   Dental advisory given  Plan Discussed with: CRNA and Anesthesiologist  Anesthesia  Plan Comments:         Anesthesia Quick Evaluation

## 2016-09-19 NOTE — Progress Notes (Signed)
Dr Randa Lynn aware of oxygen sat 92 to 93 on nasal cannula at 3 liters

## 2016-09-19 NOTE — Op Note (Signed)
09/19/2016  9:29 AM    Diane Vasquez  [id number]   Pre-Op Dx:  Right inferior parathyroid adenoma  Post-op Dx: Right inferior parathyroid adenoma  Proc: Incision right inferior parathyroid gland   Surg:  Huey Romans   assistant: Carloyn Manner  Anes:  GOT  EBL:  20 mL  Comp:  None  Findings:  The parathyroid adenoma was underneath the right recurrent laryngeal nerve and hiding posterior to the carotid artery in the plane between the trachea and carotid. It had one superior stalk were was attached with its blood supply.  Procedure: The patient was given general anesthesia by oral endotracheal intubation. A large electrode was placed on the endotracheal tube and visualized to be placed between the cords to make sure that the recurrent laryngeal nerve was monitored throughout the case. The marked on the anterior low neck in a skin crease about 2 cm above the sternal notch. The incision line was just to the right side. 3 mL of 1% Xylocaine with epi 1 200,000 were infiltrated into the skin. She was prepped and draped sterile fashion.  The skin incision was created and carried through the subcutaneous and the platysmal layer. The patient had a good deal of subcutaneous fat that was removed out of the way. The strap muscles were divided in the midline and again there was subcutaneous fat was noted. Dissection was carried carefully beneath the inferior border of the thyroid gland into the space just lateral to the trachea. There was a lot of fatty tissue in this area that was bluntly dissected through and some of the fatty tissue was removed. The recurrent laryngeal nerve was found and tracked superiorly underneath the thyroid gland. Careful dissection was made in the gutter between the carotid artery and the tracheal wall to look for the parathyroid gland. There was a lot of fat tissue here that was removed to get down to a thin fascial layer, that the parathyroid gland was lying  underneath. The fascial layer was removed and the tumor was dissected around. It was about 2-1/2 cm in length and at least a centimeter half in thickness. It had a capsule around it that was loosely attached to the fascia inferiorly but had a firm attachment superiorly where the vasculature was. The entire parathyroid gland was freed except for the superior vascular attachment, and then this was cut across with the Harmonic scalpel. This freed up the entire inferior parathyroid adenoma and the specimen was removed for permanent section. The nerve was stimulated and still had good movement of the larynx. The wound was copiously irrigated. There is no significant bleeding.  A 7 French TLS drain was placed through a separate stab incision. The strap muscles were closed loosely in the midline. The platysmal layer was then closed. The dermis was then closed using 40 Vicryls. The skin edges were held malposition with a 6-0 Prolene running locking suture. The drain was placed to low continuous Vacutainer suction. The wound was covered with a small amount of bacitracin ointment followed by Telfa and Tegaderm. The patient was awakened taken to the recovery room in satisfactory condition. There were no operative complications.  Dispo:   To PACU to be observed for the afternoon. Plan:  If she is doing well we'll plan to discharge home this evening. We will use codeine for pain since she does not tolerate tramadol.  Diane Vasquez  09/19/2016 9:29 AM

## 2016-09-19 NOTE — Anesthesia Postprocedure Evaluation (Signed)
Anesthesia Post Note  Patient: Diane Vasquez  Procedure(s) Performed: Procedure(s) (LRB): EXCISION PARATHYROID ADENOMA (Right)  Patient location during evaluation: PACU Anesthesia Type: General Level of consciousness: awake and alert and oriented Pain management: pain level controlled Vital Signs Assessment: post-procedure vital signs reviewed and stable Respiratory status: spontaneous breathing, nonlabored ventilation and respiratory function stable Cardiovascular status: blood pressure returned to baseline and stable Postop Assessment: no signs of nausea or vomiting Anesthetic complications: no    Last Vitals:  Vitals:   09/19/16 1026 09/19/16 1028  BP:  113/60  Pulse: 79 81  Resp: 18 (!) 22  Temp: (!) 36 C     Last Pain:  Vitals:   09/19/16 0616  TempSrc: Oral                 Avaiah Stempel

## 2016-09-19 NOTE — Anesthesia Procedure Notes (Signed)
Procedure Name: Intubation Date/Time: 09/19/2016 7:25 AM Performed by: Jonna Clark Pre-anesthesia Checklist: Patient identified, Patient being monitored, Timeout performed, Emergency Drugs available and Suction available Patient Re-evaluated:Patient Re-evaluated prior to inductionOxygen Delivery Method: Circle system utilized Preoxygenation: Pre-oxygenation with 100% oxygen Intubation Type: IV induction Ventilation: Mask ventilation without difficulty Laryngoscope Size: Miller and 2 Grade View: Grade I Tube type: Oral Tube size: 7.0 mm Number of attempts: 1 Placement Confirmation: ETT inserted through vocal cords under direct vision,  positive ETCO2 and breath sounds checked- equal and bilateral Secured at: 21 cm Tube secured with: Tape Dental Injury: Teeth and Oropharynx as per pre-operative assessment

## 2016-09-20 LAB — SURGICAL PATHOLOGY

## 2016-09-21 ENCOUNTER — Other Ambulatory Visit
Admission: RE | Admit: 2016-09-21 | Discharge: 2016-09-21 | Disposition: A | Discharge status: Home / Self Care | Payer: 59 | Source: Ambulatory Visit | Attending: Otolaryngology | Admitting: Otolaryngology

## 2016-09-21 LAB — CALCIUM: Calcium: 8.7 mg/dL — ABNORMAL LOW (ref 8.9–10.3)

## 2016-09-22 LAB — PARATHYROID HORMONE, INTACT (NO CA): PTH: 49 pg/mL (ref 15–65)

## 2016-10-28 DIAGNOSIS — Z483 Aftercare following surgery for neoplasm: Secondary | ICD-10-CM | POA: Diagnosis not present

## 2016-11-27 ENCOUNTER — Encounter: Payer: Self-pay | Admitting: Gynecology

## 2016-11-27 ENCOUNTER — Ambulatory Visit
Admission: EM | Admit: 2016-11-27 | Discharge: 2016-11-27 | Disposition: A | Discharge status: Home / Self Care | Payer: 59 | Attending: Emergency Medicine | Admitting: Emergency Medicine

## 2016-11-27 DIAGNOSIS — J0100 Acute maxillary sinusitis, unspecified: Secondary | ICD-10-CM

## 2016-11-27 HISTORY — DX: Disorder of thyroid, unspecified: E07.9

## 2016-11-27 LAB — RAPID STREP SCREEN (MED CTR MEBANE ONLY): Streptococcus, Group A Screen (Direct): NEGATIVE

## 2016-11-27 MED ORDER — AMOXICILLIN-POT CLAVULANATE 875-125 MG PO TABS: 1.0000 | ORAL_TABLET | Freq: Two times a day (BID) | ORAL | 0 refills | Status: DC

## 2016-11-27 MED ORDER — MOMETASONE FUROATE 50 MCG/ACT NA SUSP: 2.0000 | Freq: Every day | NASAL | 0 refills | Status: DC

## 2016-11-27 MED ORDER — IBUPROFEN 800 MG PO TABS: 800.0000 mg | ORAL_TABLET | Freq: Three times a day (TID) | ORAL | 0 refills | Status: DC | PRN

## 2016-11-27 NOTE — Discharge Instructions (Signed)
Take the medication as written. You may take 800 mg of motrin with 1 gram of tylenol up to 3 times a day as needed for pain. This is an effective combination for pain.  Most sinus infections are viral and do not need antibiotics unless you have a high fever, have had this for 10 days, or you get better and then get sick again. Use a neti pot or the NeilMed sinus rinse as often as you want to to reduce nasal congestion. Follow the directions on the box.   Go to www.goodrx.com to look up your medications. This will give you a list of where you can find your prescriptions at the most affordable prices.

## 2016-11-27 NOTE — ED Triage Notes (Signed)
Patient c/o sinus pressure / congestion / coughing /headace x 3 days.

## 2016-11-27 NOTE — ED Provider Notes (Signed)
HPI  SUBJECTIVE:  Diane Vasquez is a 61 y.o. female who presents with clear and yellow nasal congestion, rhinorrhea, post nasal drip, frontal headache, malaise, sinus pain and pressure for the past 3 days. She reports a cough productive of yellowish sputum. She reports sore throat secondary to the cough and drainage. She discontinued her Zyrtec, started herself on Mucinex D and BC powders. No aggravating or alleviating factors. She denies fevers. She states that her face feels "swollen around her eyes" but denies any facial swelling. No dental pain. No allergy symptoms, ear pain. No antibiotic in the past month. No antipyretic in the past 6-8 hours. She has a past medical history of allergies, sinusitis status post sinus surgery, pneumonia, hyperparathyroidism. No history of diabetes, hypertension. PMD: Dr. Kandice Robinsons. ENT: Dr. Kathyrn Sheriff.    Past Medical History:  Diagnosis Date  . Allergic rhinitis   . Cancer ([place])    MELANOMA  . GERD (gastroesophageal reflux disease)   . Hyperlipidemia   . Thyroid disease     Past Surgical History:  Procedure Laterality Date  . ABDOMINAL HYSTERECTOMY    . BREAST CYST ASPIRATION Right   . CTR    . PARATHYROIDECTOMY Right 09/19/2016   Procedure: EXCISION PARATHYROID ADENOMA;  Surgeon: Margaretha Sheffield, MD;  Location: [facility] ORS;  Service: ENT;  Laterality: Right;  . SINUS SURGERY WITH INSTATRAK      Family History  Problem Relation Age of Onset  . Heart attack Mother   . Heart attack Maternal Grandfather     Social History  Substance Use Topics  . Smoking status: Never Smoker  . Smokeless tobacco: Never Used  . Alcohol use Yes     Comment: occ    No current facility-administered medications for this encounter.   Current Outpatient Prescriptions:  .  acetaminophen (TYLENOL) 500 MG tablet, Take 1,000 mg by mouth every 6 (six) hours as needed for headache., Disp: , Rfl:  .  Ascorbic Acid (VITAMIN C) 1000 MG tablet, Take 1,000 mg by mouth daily., Disp:  , Rfl:  .  Calcium Carbonate-Vit D-Min (CALCIUM 1200 PO), Take by mouth., Disp: , Rfl:  .  dapsone 100 MG tablet, Take 50 mg by mouth daily. , Disp: , Rfl:  .  Ergocalciferol (VITAMIN D2 PO), Take 1.25 mg by mouth., Disp: , Rfl:  .  fluocinonide ointment (LIDEX) [id number] %, Apply 1 application topically daily as needed (rash). , Disp: , Rfl:  .  Ivermectin (SOOLANTRA) 1 % CREA, Apply 1 application topically at bedtime., Disp: , Rfl:  .  metroNIDAZOLE (METROGEL) 1 % gel, Apply 1 application topically daily. , Disp: , Rfl:  .  omeprazole (PRILOSEC) 20 MG capsule, Take 20 mg by mouth daily. , Disp: , Rfl:  .  pyridOXINE (VITAMIN B-6) 100 MG tablet, Take 100 mg by mouth daily., Disp: , Rfl:  .  amoxicillin-clavulanate (AUGMENTIN) 875-125 MG tablet, Take 1 tablet by mouth 2 (two) times daily. X 7 days, Disp: 14 tablet, Rfl: 0 .  Aspirin-Acetaminophen-Caffeine (GOODY HEADACHE PO), Take 1 packet by mouth daily as needed (pain)., Disp: , Rfl:  .  ibuprofen (ADVIL,MOTRIN) 800 MG tablet, Take 1 tablet (800 mg total) by mouth every 8 (eight) hours as needed., Disp: 30 tablet, Rfl: 0 .  mometasone (NASONEX) 50 MCG/ACT nasal spray, Place 2 sprays into the nose daily., Disp: 17 g, Rfl: 0  Allergies  Allergen Reactions  . Tramadol     Lips numb     ROS  As noted  in HPI.   Physical Exam  BP (!) 116/50 (BP Location: Left Arm)   Pulse (!) 104   Temp 99.6 F (37.6 C) (Oral)   Resp 16   Ht 5\' 4"  (1.626 m)   Wt 220 lb (99.8 kg)   SpO2 98%   BMI 37.76 kg/m   Constitutional: Well developed, well nourished, no acute distress Eyes:  EOMI, conjunctiva normal bilaterally HENT: Normocephalic, atraumatic,mucus membranes moist. TMs normal bilaterally. - clear  nasal congestion. Not swollen, but  red turbinates. + maxillary sinus tenderness,  - frontal sinus tenderness. Oropharynx normal . Tonsils surgically absent. - appreciable postnasal drip.  Respiratory: Normal inspiratory effort lungs clear  bilaterally. Cardiovascular: Mild tachycardia, no murmurs rubs or gallops GI: nondistended skin: No rash, skin intact Musculoskeletal: no deformities Neurologic: Alert & oriented x 3, no focal neuro deficits Psychiatric: Speech and behavior appropriate   ED Course   Medications - No data to display  Orders Placed This Encounter  Procedures  . Rapid strep screen    Standing Status:   Standing    Number of Occurrences:   1  . Culture, group A strep    Standing Status:   Standing    Number of Occurrences:   1    Results for orders placed or performed during the hospital encounter of 11/27/16 (from the past 24 hour(s))  Rapid strep screen     Status: None   Collection Time: 11/27/16 10:42 AM  Result Value Ref Range   Streptococcus, Group A Screen (Direct) NEGATIVE NEGATIVE   No results found.  ED Clinical Impression  Acute maxillary sinusitis, recurrence not specified   ED Assessment/Plan  Rapid strep negative.    No fevers >102, has had sx for < 10 days. No historical or objective evidence of bacterial infection. No clear indication for abx. Will start nasal steriods, continue mucinex-d, increase fluids, nasal saline irrigation,  Tylenol/ 800 mg motrin prn pain. also home with a wait-and-see prescription of Augmentin given that she does have a history of recurrent sinusitis. Follow-up with PMD or ENT as needed Discussed MDM and plan with pt.. Pt agrees with plan   *This clinic note was created using Dragon dictation software. Therefore, there may be occasional mistakes despite careful proofreading.  ?    Melynda Ripple, MD 11/27/16 1146

## 2016-11-30 DIAGNOSIS — D351 Benign neoplasm of parathyroid gland: Secondary | ICD-10-CM | POA: Diagnosis not present

## 2016-11-30 DIAGNOSIS — E890 Postprocedural hypothyroidism: Secondary | ICD-10-CM | POA: Diagnosis not present

## 2016-11-30 LAB — CULTURE, GROUP A STREP (THRC)

## 2016-12-07 DIAGNOSIS — Z86018 Personal history of other benign neoplasm: Secondary | ICD-10-CM | POA: Diagnosis not present

## 2016-12-07 DIAGNOSIS — Q828 Other specified congenital malformations of skin: Secondary | ICD-10-CM | POA: Diagnosis not present

## 2016-12-07 DIAGNOSIS — Z85820 Personal history of malignant melanoma of skin: Secondary | ICD-10-CM | POA: Diagnosis not present

## 2016-12-07 DIAGNOSIS — Z872 Personal history of diseases of the skin and subcutaneous tissue: Secondary | ICD-10-CM | POA: Diagnosis not present

## 2017-02-24 ENCOUNTER — Other Ambulatory Visit: Payer: Self-pay | Admitting: Otolaryngology

## 2017-02-24 DIAGNOSIS — E039 Hypothyroidism, unspecified: Secondary | ICD-10-CM

## 2017-02-28 ENCOUNTER — Ambulatory Visit
Admission: RE | Admit: 2017-02-28 | Discharge: 2017-02-28 | Disposition: A | Discharge status: Home / Self Care | Payer: 59 | Source: Ambulatory Visit | Attending: Internal Medicine | Admitting: Internal Medicine

## 2017-02-28 DIAGNOSIS — R918 Other nonspecific abnormal finding of lung field: Secondary | ICD-10-CM | POA: Insufficient documentation

## 2017-02-28 DIAGNOSIS — K7689 Other specified diseases of liver: Secondary | ICD-10-CM | POA: Diagnosis not present

## 2017-02-28 DIAGNOSIS — R911 Solitary pulmonary nodule: Secondary | ICD-10-CM | POA: Diagnosis not present

## 2017-02-28 DIAGNOSIS — D351 Benign neoplasm of parathyroid gland: Secondary | ICD-10-CM | POA: Diagnosis not present

## 2017-02-28 HISTORY — DX: Malignant melanoma of skin, unspecified: C43.9

## 2017-02-28 MED ORDER — IOPAMIDOL (ISOVUE-300) INJECTION 61%
75.0000 mL | Freq: Once | INTRAVENOUS | Status: AC | PRN
  Administered 2017-02-28: 75 mL via INTRAVENOUS

## 2017-03-01 ENCOUNTER — Other Ambulatory Visit: Payer: Self-pay | Admitting: *Deleted

## 2017-03-06 ENCOUNTER — Ambulatory Visit
Admission: RE | Admit: 2017-03-06 | Discharge: 2017-03-06 | Disposition: A | Discharge status: Home / Self Care | Payer: 59 | Source: Ambulatory Visit | Attending: Otolaryngology | Admitting: Otolaryngology

## 2017-03-06 DIAGNOSIS — E890 Postprocedural hypothyroidism: Secondary | ICD-10-CM | POA: Diagnosis not present

## 2017-03-06 DIAGNOSIS — E039 Hypothyroidism, unspecified: Secondary | ICD-10-CM

## 2017-03-13 DIAGNOSIS — M65311 Trigger thumb, right thumb: Secondary | ICD-10-CM | POA: Diagnosis not present

## 2017-03-13 DIAGNOSIS — M65341 Trigger finger, right ring finger: Secondary | ICD-10-CM | POA: Diagnosis not present

## 2017-03-14 ENCOUNTER — Ambulatory Visit (INDEPENDENT_AMBULATORY_CARE_PROVIDER_SITE_OTHER): Payer: 59 | Admitting: Internal Medicine

## 2017-03-14 ENCOUNTER — Encounter: Payer: Self-pay | Admitting: Internal Medicine

## 2017-03-14 VITALS — BP 132/80 | HR 72 | Resp 16 | Ht 64.0 in | Wt 223.0 lb

## 2017-03-14 DIAGNOSIS — R918 Other nonspecific abnormal finding of lung field: Secondary | ICD-10-CM | POA: Diagnosis not present

## 2017-03-14 DIAGNOSIS — Z87898 Personal history of other specified conditions: Secondary | ICD-10-CM

## 2017-03-14 DIAGNOSIS — D351 Benign neoplasm of parathyroid gland: Secondary | ICD-10-CM

## 2017-03-14 DIAGNOSIS — Z87448 Personal history of other diseases of urinary system: Secondary | ICD-10-CM

## 2017-03-14 NOTE — Patient Instructions (Signed)
Follow up as needed

## 2017-03-14 NOTE — Progress Notes (Signed)
[facility] Consultation      Date: 03/14/2017,   MRN# [id number] Diane Vasquez 10/17/1956 Code Status:  Code Status History    This patient does not have a recorded code status. Please follow your organizational policy for patients in this situation.     Hosp day:@LENGTHOFSTAYDAYS @ Referring MD: @ATDPROV @     PCP:      AdmissionWeight: 223 lb (101.2 kg)                 CurrentWeight: 223 lb (101.2 kg) Diane Vasquez is a 61 y.o. old female seen in consultation for COUGH at the request of Dr. Kandice Robinsons     CHIEF COMPLAINT:  Cough resolved Follow up CT chest    HISTORY OF PRESENT ILLNESS   PFT's WNL 6MWT WNL  CT chest shows resolution of GGO but has residual small sub centimeter RLL nodules that have not increased in Size No new nodules noted  findings discussed with patient  Patient stopped dapsone therapy for skin disease If she restart dapsone then would recommend CT chest    Current Medication:  Current Outpatient Prescriptions:  .  Ascorbic Acid (VITAMIN C) 1000 MG tablet, Take 1,000 mg by mouth daily., Disp: , Rfl:  .  Calcium Carbonate-Vit D-Min (CALCIUM 1200 PO), Take by mouth., Disp: , Rfl:  .  Ergocalciferol (VITAMIN D2 PO), Take 1.25 mg by mouth., Disp: , Rfl:  .  fluocinonide ointment (LIDEX) 0.08 %, Apply 1 application topically daily as needed (rash). , Disp: , Rfl:  .  Ivermectin (SOOLANTRA) 1 % CREA, Apply 1 application topically at bedtime., Disp: , Rfl:  .  metroNIDAZOLE (METROGEL) 1 % gel, Apply 1 application topically daily. , Disp: , Rfl:  .  mometasone (NASONEX) 50 MCG/ACT nasal spray, Place 2 sprays into the nose daily., Disp: 17 g, Rfl: 0 .  naltrexone (DEPADE) 50 MG tablet, Take 50 mg by mouth at bedtime., Disp: , Rfl: 2 .  omeprazole (PRILOSEC) 20 MG capsule, Take 20 mg by mouth daily. , Disp: , Rfl:  .  pyridOXINE (VITAMIN B-6) 100 MG tablet, Take 100 mg by mouth daily., Disp: , Rfl:  .  tretinoin (RETIN-A) 0.025  % cream, Apply 1 application topically daily., Disp: , Rfl:     ALLERGIES   Tramadol     REVIEW OF SYSTEMS   Review of Systems  Constitutional: Negative for chills, fever, malaise/fatigue and weight loss.  Respiratory: Negative for cough, hemoptysis, sputum production, shortness of breath and wheezing.   Cardiovascular: Negative for chest pain, palpitations, orthopnea and leg swelling.  Musculoskeletal: Negative for joint pain.  All other systems reviewed and are negative.    VS: BP 132/80 (BP Location: Right Arm, Patient Position: Sitting, Cuff Size: Normal)   Pulse 72   Resp 16   Ht 5\' 4"  (1.626 m)   Wt 223 lb (101.2 kg)   SpO2 94%   BMI 38.28 kg/m      PHYSICAL EXAM  Physical Exam  Constitutional: She appears well-developed and well-nourished.  Eyes: EOM are normal.  Cardiovascular: Normal rate, regular rhythm and normal heart sounds.   No murmur heard. Pulmonary/Chest: Effort normal and breath sounds normal. No respiratory distress. She has no wheezes.  Skin: Skin is warm.  Psychiatric: She has a normal mood and affect.         ASSESSMENT/PLAN  61 YO WHITE FEMALE SEEN TODAY FOR follow up CT chest. -which has reolved. All Pulmonary testing are WNL Patient  has 2 RT RLL 3 mm nodules-likely benign  1.CONTINUE ZYRTEC DAILY 2.CONTINUE PPI DAILY 3.follow up in 1 year as needed  If she restarts dapsone therapy, will recommend CT chest   Follow up in 1 year as needed    Patient satisfied with Plan of action and management. All questions answered  Corrin Parker, M.D.  [facility]  Medical Director [place] Director [facility] Cardio-Pulmonary Department

## 2017-03-15 DIAGNOSIS — E890 Postprocedural hypothyroidism: Secondary | ICD-10-CM | POA: Diagnosis not present

## 2017-03-15 DIAGNOSIS — Z483 Aftercare following surgery for neoplasm: Secondary | ICD-10-CM | POA: Diagnosis not present

## 2017-04-10 DIAGNOSIS — M65341 Trigger finger, right ring finger: Secondary | ICD-10-CM | POA: Diagnosis not present

## 2017-04-10 DIAGNOSIS — M79641 Pain in right hand: Secondary | ICD-10-CM | POA: Diagnosis not present

## 2017-04-10 DIAGNOSIS — M65311 Trigger thumb, right thumb: Secondary | ICD-10-CM | POA: Diagnosis not present

## 2017-06-12 ENCOUNTER — Other Ambulatory Visit: Payer: Self-pay

## 2017-06-12 DIAGNOSIS — Z8249 Family history of ischemic heart disease and other diseases of the circulatory system: Secondary | ICD-10-CM

## 2017-06-12 NOTE — Progress Notes (Signed)
Pt's husband is pt of Dr. Rockey Situ. Dr. Rockey Situ offered to schedule Diane Vasquez for CT Calcium Scan when her husband is set up.  Pt provided w/ # to scheduling. Will call her w/ results and sched f/u w/ Dr. Rockey Situ if needed.

## 2017-06-13 DIAGNOSIS — Z872 Personal history of diseases of the skin and subcutaneous tissue: Secondary | ICD-10-CM | POA: Diagnosis not present

## 2017-06-13 DIAGNOSIS — D485 Neoplasm of uncertain behavior of skin: Secondary | ICD-10-CM | POA: Diagnosis not present

## 2017-06-13 DIAGNOSIS — Z85820 Personal history of malignant melanoma of skin: Secondary | ICD-10-CM | POA: Diagnosis not present

## 2017-06-13 DIAGNOSIS — C44529 Squamous cell carcinoma of skin of other part of trunk: Secondary | ICD-10-CM | POA: Diagnosis not present

## 2017-06-13 DIAGNOSIS — Z808 Family history of malignant neoplasm of other organs or systems: Secondary | ICD-10-CM | POA: Diagnosis not present

## 2017-06-13 DIAGNOSIS — L570 Actinic keratosis: Secondary | ICD-10-CM | POA: Diagnosis not present

## 2017-06-15 ENCOUNTER — Inpatient Hospital Stay: Admission: RE | Admit: 2017-06-15 | Payer: 59 | Source: Ambulatory Visit

## 2017-06-22 ENCOUNTER — Ambulatory Visit (INDEPENDENT_AMBULATORY_CARE_PROVIDER_SITE_OTHER)
Admission: RE | Admit: 2017-06-22 | Discharge: 2017-06-22 | Disposition: A | Discharge status: Home / Self Care | Payer: Self-pay | Source: Ambulatory Visit | Attending: Cardiovascular Disease | Admitting: Cardiovascular Disease

## 2017-06-22 DIAGNOSIS — Z8249 Family history of ischemic heart disease and other diseases of the circulatory system: Secondary | ICD-10-CM

## 2017-07-04 DIAGNOSIS — R319 Hematuria, unspecified: Secondary | ICD-10-CM | POA: Diagnosis not present

## 2017-07-04 DIAGNOSIS — R300 Dysuria: Secondary | ICD-10-CM | POA: Diagnosis not present

## 2017-07-18 DIAGNOSIS — C44529 Squamous cell carcinoma of skin of other part of trunk: Secondary | ICD-10-CM | POA: Diagnosis not present

## 2017-07-18 DIAGNOSIS — L570 Actinic keratosis: Secondary | ICD-10-CM | POA: Diagnosis not present

## 2017-08-03 ENCOUNTER — Other Ambulatory Visit: Payer: Self-pay

## 2017-08-03 DIAGNOSIS — R3129 Other microscopic hematuria: Secondary | ICD-10-CM

## 2017-08-04 ENCOUNTER — Encounter: Payer: Self-pay | Admitting: Urology

## 2017-08-04 ENCOUNTER — Ambulatory Visit (INDEPENDENT_AMBULATORY_CARE_PROVIDER_SITE_OTHER): Payer: 59 | Admitting: Urology

## 2017-08-04 ENCOUNTER — Other Ambulatory Visit
Admission: RE | Admit: 2017-08-04 | Discharge: 2017-08-04 | Disposition: A | Discharge status: Home / Self Care | Payer: 59 | Source: Ambulatory Visit | Attending: Urology | Admitting: Urology

## 2017-08-04 VITALS — BP 140/69 | HR 90 | Ht 64.0 in | Wt 230.0 lb

## 2017-08-04 DIAGNOSIS — R3129 Other microscopic hematuria: Secondary | ICD-10-CM

## 2017-08-04 DIAGNOSIS — R310 Gross hematuria: Secondary | ICD-10-CM | POA: Diagnosis not present

## 2017-08-04 DIAGNOSIS — E669 Obesity, unspecified: Secondary | ICD-10-CM | POA: Insufficient documentation

## 2017-08-04 HISTORY — DX: Obesity, unspecified: E66.9

## 2017-08-04 LAB — URINALYSIS, COMPLETE (UACMP) WITH MICROSCOPIC
Bilirubin Urine: NEGATIVE
GLUCOSE, UA: NEGATIVE mg/dL
Hgb urine dipstick: NEGATIVE
KETONES UR: NEGATIVE mg/dL
LEUKOCYTES UA: NEGATIVE
Nitrite: NEGATIVE
PROTEIN: NEGATIVE mg/dL
Specific Gravity, Urine: 1.025 (ref 1.005–1.030)
pH: 6 (ref 5.0–8.0)

## 2017-08-04 NOTE — Progress Notes (Signed)
08/04/2017 5:39 PM   Diane Vasquez 1956/03/02 [id number]  Referring provider: Sallee Lange, NP [address] [place], [place] [address]  Chief Complaint  Patient presents with  . Hematuria    New Patient    HPI: 61 year old nonsmoker who presents today for further evaluation of episode of painless gross hematuria/microscopic hematuria.  She reports that just over a month ago, she had what she felt was a GI illness with abdominal pain, vomiting, and diarrhea. Almost 2 weeks later, she had an episode of painless gross hematuria. She reports that she is on blood in the toilet as well as with wiping.  She denies any other associated urinary symptoms including urinary frequency, dysuria, or pain. She did follow-up with her PCP at which time microscopic hematuria was noted in the absence of infection.  She denies a personal history of kidney stones. She does report that over the past several weeks, she's had some tenderness in her right lower quadrant which is atypical. She also has diffuse low back pain. She wonders if this may be related to possible stone.  She does have a history of a parathyroid adenoma status post surgical excision on 08/2016. She does not have a history of hyperparathyroidism.  The excised nodule was an incidental finding. Previous PTH and calcium labs are normal.  Never smoker.     PMH: Past Medical History:  Diagnosis Date  . Allergic rhinitis   . Chronic fatigue, unspecified 02/16/2016  . Diastolic dysfunction without heart failure 03/17/2016  . Familial benign pemphigus 02/16/2016  . GERD (gastroesophageal reflux disease)   . Hypercholesterolemia 02/19/2014  . Hyperlipidemia   . Melanoma ([place]) 2016   she has had 3 different places.  . Obesity (BMI 30-39.9) 08/04/2017  . Parathyroid adenoma 09/19/2016  . Polyarthralgia 02/16/2016  . SOB (shortness of breath) 03/17/2016  . Thyroid disease     Surgical History: Past Surgical History:  Procedure  Laterality Date  . ABDOMINAL HYSTERECTOMY    . BREAST CYST ASPIRATION Right   . CTR    . PARATHYROIDECTOMY Right 09/19/2016   Procedure: EXCISION PARATHYROID ADENOMA;  Surgeon: Margaretha Sheffield, MD;  Location: [facility] ORS;  Service: ENT;  Laterality: Right;  . SINUS SURGERY WITH INSTATRAK      Home Medications:  Allergies as of 08/04/2017      Reactions   Tramadol    Lips numb      Medication List       Accurate as of 08/04/17  5:39 PM. Always use your most recent med list.          CALCIUM 1200 PO Take by mouth.   fluocinonide ointment 0.05 % Commonly known as:  LIDEX Apply 1 application topically daily as needed (rash).   metroNIDAZOLE 1 % gel Commonly known as:  METROGEL Apply 1 application topically daily.   mometasone 50 MCG/ACT nasal spray Commonly known as:  NASONEX Place 2 sprays into the nose daily.   naltrexone 50 MG tablet Commonly known as:  DEPADE Take 50 mg by mouth at bedtime.   omeprazole 20 MG capsule Commonly known as:  PRILOSEC Take 20 mg by mouth daily.   pyridOXINE 100 MG tablet Commonly known as:  VITAMIN B-6 Take 100 mg by mouth daily.   SOOLANTRA 1 % Crea Generic drug:  Ivermectin Apply 1 application topically at bedtime.   tretinoin 0.025 % cream Commonly known as:  RETIN-A Apply 1 application topically daily.   vitamin C 1000 MG tablet Take  1,000 mg by mouth daily.   VITAMIN D2 PO Take 1.25 mg by mouth.       Allergies:  Allergies  Allergen Reactions  . Tramadol     Lips numb    Family History: Family History  Problem Relation Age of Onset  . Heart attack Mother   . Heart attack Maternal Grandfather   . Prostate cancer Neg Hx   . Bladder Cancer Neg Hx     Social History:  reports that she has never smoked. She has never used smokeless tobacco. She reports that she drinks alcohol. She reports that she does not use drugs.  ROS: UROLOGY Frequent Urination?: No Hard to postpone urination?: No Burning/pain with  urination?: No Get up at night to urinate?: No Leakage of urine?: No Urine stream starts and stops?: No Trouble starting stream?: No Do you have to strain to urinate?: No Blood in urine?: Yes Urinary tract infection?: No Sexually transmitted disease?: No Injury to kidneys or bladder?: No Painful intercourse?: No Weak stream?: No Currently pregnant?: No Vaginal bleeding?: No Last menstrual period?: n  Gastrointestinal Nausea?: No Vomiting?: No Indigestion/heartburn?: No Diarrhea?: No Constipation?: No  Constitutional Fever: No Night sweats?: No Weight loss?: No Fatigue?: No  Skin Skin rash/lesions?: No Itching?: No  Eyes Blurred vision?: No Double vision?: No  Ears/Nose/Throat Sore throat?: No Sinus problems?: No  Hematologic/Lymphatic Swollen glands?: No Easy bruising?: No  Cardiovascular Leg swelling?: No Chest pain?: No  Respiratory Cough?: No Shortness of breath?: No  Endocrine Excessive thirst?: No  Musculoskeletal Back pain?: Yes Joint pain?: No  Neurological Headaches?: No Dizziness?: No  Psychologic Depression?: No Anxiety?: No  Physical Exam: BP 140/69   Pulse 90   Ht 5\' 4"  (1.626 m)   Wt 230 lb (104.3 kg)   BMI 39.48 kg/m   Constitutional:  Alert and oriented, No acute distress. HEENT: [place] AT, moist mucus membranes.  Trachea midline, no masses. Cardiovascular: No clubbing, cyanosis, or edema. Respiratory: Normal respiratory effort, no increased work of breathing. GI: Abdomen is soft, nontender, nondistended, no abdominal masses GU: No CVA tenderness. Skin: No rashes, bruises or suspicious lesions. Neurologic: Grossly intact, no focal deficits, moving all 4 extremities. Psychiatric: Normal mood and affect.  Laboratory Data: Cr 0.8 on 02/15/17  Urinalysis Lab Results  Component Value Date   APPEARANCEUR HAZY (A) 08/04/2017   LEUKOCYTESUR NEGATIVE 08/04/2017   PROTEINUR NEGATIVE 08/04/2017   GLUCOSEU NEGATIVE 08/04/2017    RBCU 0-5 08/04/2017   BILIRUBINUR NEGATIVE 08/04/2017   NITRITE NEGATIVE 08/04/2017    Lab Results  Component Value Date   BACTERIA FEW (A) 08/04/2017    Pertinent Imaging: N/a  Assessment & Plan:    1. Gross hematuria We discussed the differential diagnosis for microscopic hematuria including nephrolithiasis, renal or upper tract tumors, bladder stones, UTIs, or bladder tumors as well as undetermined etiologies. Per AUA guidelines, I did recommend complete microscopic hematuria evaluation including CTU, possible urine cytology, and office cystoscopy.  In the setting of mild right lower quadrant pain, advised her to seek urgent medical attention if she develops fevers, chills, or worsening pain that is not able to be controlled with oral pain medication.  This pain is mildly suspicious for stones which may be the cause of her hematuria.  - CT HEMATURIA WORKUP; Future  2. Microscopic hematuria As above   Return in about 4 weeks (around 09/01/2017) for cysto/ f/u CT Urogram.  Hollice Espy, MD  [facility] [address], [address] [place], [place] [address] [phone]  

## 2017-08-15 DIAGNOSIS — L0291 Cutaneous abscess, unspecified: Secondary | ICD-10-CM | POA: Diagnosis not present

## 2017-08-15 DIAGNOSIS — D485 Neoplasm of uncertain behavior of skin: Secondary | ICD-10-CM | POA: Diagnosis not present

## 2017-08-15 DIAGNOSIS — L03313 Cellulitis of chest wall: Secondary | ICD-10-CM | POA: Diagnosis not present

## 2017-08-15 DIAGNOSIS — Q828 Other specified congenital malformations of skin: Secondary | ICD-10-CM | POA: Diagnosis not present

## 2017-08-16 ENCOUNTER — Ambulatory Visit
Admission: RE | Admit: 2017-08-16 | Discharge: 2017-08-16 | Disposition: A | Discharge status: Home / Self Care | Payer: 59 | Source: Ambulatory Visit | Attending: Urology | Admitting: Urology

## 2017-08-16 DIAGNOSIS — R310 Gross hematuria: Secondary | ICD-10-CM | POA: Diagnosis not present

## 2017-08-16 DIAGNOSIS — N2889 Other specified disorders of kidney and ureter: Secondary | ICD-10-CM | POA: Diagnosis not present

## 2017-08-16 DIAGNOSIS — R3129 Other microscopic hematuria: Secondary | ICD-10-CM | POA: Insufficient documentation

## 2017-08-16 MED ORDER — IOPAMIDOL (ISOVUE-300) INJECTION 61%
125.0000 mL | Freq: Once | INTRAVENOUS | Status: AC | PRN
  Administered 2017-08-16: 125 mL via INTRAVENOUS

## 2017-08-17 ENCOUNTER — Telehealth: Payer: Self-pay | Admitting: *Deleted

## 2017-08-17 NOTE — Telephone Encounter (Signed)
LMOM for patient to return call to office about scan results.

## 2017-08-17 NOTE — Telephone Encounter (Signed)
-----   Message from Hollice Espy, MD sent at 08/17/2017 11:22 AM EDT ----- No worrisome findings on CT scan.  We will go over in detail at her follow-up appointment.

## 2017-08-17 NOTE — Telephone Encounter (Signed)
Spoke with patient and gave results. Patient to follow up as scheduled. Patient ok with plan.

## 2017-08-31 ENCOUNTER — Other Ambulatory Visit: Payer: Self-pay

## 2017-08-31 DIAGNOSIS — R310 Gross hematuria: Secondary | ICD-10-CM

## 2017-09-01 ENCOUNTER — Encounter: Payer: Self-pay | Admitting: Urology

## 2017-09-01 ENCOUNTER — Ambulatory Visit (INDEPENDENT_AMBULATORY_CARE_PROVIDER_SITE_OTHER): Payer: 59 | Admitting: Urology

## 2017-09-01 ENCOUNTER — Other Ambulatory Visit
Admission: RE | Admit: 2017-09-01 | Discharge: 2017-09-01 | Disposition: A | Discharge status: Home / Self Care | Payer: 59 | Source: Ambulatory Visit | Attending: Urology | Admitting: Urology

## 2017-09-01 VITALS — BP 149/66 | HR 85 | Ht 64.0 in | Wt 230.0 lb

## 2017-09-01 DIAGNOSIS — R310 Gross hematuria: Secondary | ICD-10-CM

## 2017-09-01 LAB — URINALYSIS, COMPLETE (UACMP) WITH MICROSCOPIC
Bilirubin Urine: NEGATIVE
GLUCOSE, UA: NEGATIVE mg/dL
Hgb urine dipstick: NEGATIVE
KETONES UR: NEGATIVE mg/dL
LEUKOCYTES UA: NEGATIVE
Nitrite: NEGATIVE
PROTEIN: NEGATIVE mg/dL
Specific Gravity, Urine: 1.02 (ref 1.005–1.030)
pH: 7 (ref 5.0–8.0)

## 2017-09-01 MED ORDER — LIDOCAINE HCL 2 % EX GEL
1.0000 "application " | Freq: Once | CUTANEOUS | Status: AC
  Administered 2017-09-01: 1 via URETHRAL

## 2017-09-01 MED ORDER — CIPROFLOXACIN HCL 500 MG PO TABS
500.0000 mg | ORAL_TABLET | Freq: Once | ORAL | Status: AC
  Administered 2017-09-01: 500 mg via ORAL

## 2017-09-02 NOTE — Progress Notes (Signed)
   09/02/17  CC:  Chief Complaint  Patient presents with  . Cysto    HPI: 61 year old female who presents for cystoscopy today to complete her microscopic/gross hematuria workup.  Since her last visit, she is undergone CT urogram 08/16/2017 which showed an incidental right calyceal diverticulum without any filling defects, no stones, or other GU pathology.   She had no further episodes of gross hematuria.  As per previous visit, she did have an episode of flank pain 2 weeks preceding her episode of painless gross hematuria.  Otherwise, no additional symptoms.  Blood pressure (!) 149/66, pulse 85, height 5\' 4"  (1.626 m), weight 230 lb (104.3 kg). NED. A&Ox3.   No respiratory distress   Abd soft, NT, ND Normal external genitalia with patent urethral meatus  Cystoscopy Procedure Note  Patient identification was confirmed, informed consent was obtained, and patient was prepped using Betadine solution.  Lidocaine jelly was administered per urethral meatus.    Preoperative abx where received prior to procedure.    Procedure: - Flexible cystoscope introduced, without any difficulty.   - Thorough search of the bladder revealed:    normal urethral meatus    normal urothelium    no stones    no ulcers     no tumors    no urethral polyps    no trabeculation  - Ureteral orifices were normal in position and appearance.  Post-Procedure: - Patient tolerated the procedure well  Assessment/ Plan:   1. Gross hematuria CT urogram reviewed personally with the patient today Cystoscopy negative No obvious GU pathology identified a workup Suspect this may have been related to ? possible stone episode which is resolved   2.  Cecal diverticulum Asymptomatic/incidental No further evaluation needed  F/u prn  Hollice Espy, MD

## 2017-10-01 ENCOUNTER — Encounter: Payer: Self-pay | Admitting: Internal Medicine

## 2017-10-01 ENCOUNTER — Other Ambulatory Visit: Payer: Self-pay | Admitting: Internal Medicine

## 2017-10-01 DIAGNOSIS — Z87448 Personal history of other diseases of urinary system: Secondary | ICD-10-CM | POA: Insufficient documentation

## 2017-10-03 ENCOUNTER — Ambulatory Visit: Payer: 59 | Admitting: Internal Medicine

## 2017-10-03 ENCOUNTER — Encounter: Payer: Self-pay | Admitting: Internal Medicine

## 2017-10-03 VITALS — BP 142/82 | HR 87 | Ht 64.0 in | Wt 233.0 lb

## 2017-10-03 DIAGNOSIS — Z23 Encounter for immunization: Secondary | ICD-10-CM | POA: Diagnosis not present

## 2017-10-03 DIAGNOSIS — Z1239 Encounter for other screening for malignant neoplasm of breast: Secondary | ICD-10-CM

## 2017-10-03 DIAGNOSIS — Z87898 Personal history of other specified conditions: Secondary | ICD-10-CM

## 2017-10-03 DIAGNOSIS — K219 Gastro-esophageal reflux disease without esophagitis: Secondary | ICD-10-CM

## 2017-10-03 DIAGNOSIS — Z1231 Encounter for screening mammogram for malignant neoplasm of breast: Secondary | ICD-10-CM | POA: Diagnosis not present

## 2017-10-03 DIAGNOSIS — Z87448 Personal history of other diseases of urinary system: Secondary | ICD-10-CM

## 2017-10-03 MED ORDER — OMEPRAZOLE 20 MG PO CPDR: 20.0000 mg | DELAYED_RELEASE_CAPSULE | Freq: Every day | ORAL | 3 refills | Status: DC

## 2017-10-03 NOTE — Progress Notes (Signed)
Date:  10/03/2017   Name:  Diane Vasquez   DOB:  09-24-1956   MRN:  [id number]   Chief Complaint: Establish Care (FLU SHOT. ) and Gastroesophageal Reflux (Needs refill on omeprazole. Cheaper if has prescription. ) Gastroesophageal Reflux  She complains of coughing. She reports no abdominal pain, no chest pain, no nausea or no wheezing. This is a recurrent problem. The problem occurs rarely. Pertinent negatives include no fatigue. She has tried a PPI for the symptoms.  Rash  This is a recurrent problem. The current episode started more than 1 year ago. The problem has been waxing and waning since onset. Associated symptoms include coughing and shortness of breath. Pertinent negatives include no fatigue, fever or vomiting. Past treatments include topical steroids (and Dapsone plus naltrexone for pemphigus).  Hematuria - recently evaluated by Urology - no cause found.  Hematuria resolved crrently. Cough - seen by Pulmonary and felt to be due to Dapsone therapy.  CT chest showed a few small benign appearing nodules.  The cough had improved earlier this year but now worsening.  She has started to taper the Dapsone again and hopes the cough will improve.    Review of Systems  Constitutional: Negative for chills, fatigue and fever.  Respiratory: Positive for cough and shortness of breath. Negative for chest tightness and wheezing.   Cardiovascular: Negative for chest pain and palpitations.  Gastrointestinal: Negative for abdominal pain, nausea and vomiting.  Musculoskeletal: Positive for myalgias. Negative for joint swelling.  Skin: Positive for color change and rash.  Neurological: Negative for dizziness and headaches.  Psychiatric/Behavioral: Negative for sleep disturbance.    Patient Active Problem List   Diagnosis Date Noted  . Hx of hematuria 10/01/2017  . Obesity (BMI 30-39.9) 08/04/2017  . Parathyroid adenoma 09/19/2016  . Diastolic dysfunction without heart failure 03/17/2016    . History of multiple pulmonary nodules 03/17/2016  . Chronic fatigue, unspecified 02/16/2016  . Familial benign pemphigus 02/16/2016  . Polyarthralgia 02/16/2016  . GERD (gastroesophageal reflux disease) 02/03/2016  . Allergic rhinitis 02/03/2016  . Hypercholesterolemia 02/19/2014    Prior to Admission medications   Medication Sig Start Date End Date Taking? Authorizing Provider  Ascorbic Acid (VITAMIN C) 1000 MG tablet Take 1,000 mg by mouth daily.   Yes [provider]  Calcium Carbonate-Vit D-Min (CALCIUM 1200 PO) Take by mouth.   Yes [provider]  dapsone 100 MG tablet Take 50 mg by mouth daily.  08/03/17  Yes [provider]  fluocinonide ointment (LIDEX) 0.86 % Apply 1 application topically daily as needed (rash).  05/25/16  Yes [provider]  Ivermectin (SOOLANTRA) 1 % CREA Apply 1 application topically at bedtime.   Yes [provider]  metroNIDAZOLE (METROGEL) 1 % gel Apply 1 application topically daily.    Yes [provider]  omeprazole (PRILOSEC) 20 MG capsule Take 20 mg by mouth daily.  10/10/14  Yes [provider]  pyridOXINE (VITAMIN B-6) 100 MG tablet Take 100 mg by mouth daily.   Yes [provider]  tretinoin (RETIN-A) 0.025 % cream Apply 1 application topically daily.   Yes [provider]  naltrexone (DEPADE) 50 MG tablet Take 50 mg by mouth at bedtime. 03/06/17   [provider]    Allergies  Allergen Reactions  . Atorvastatin Other (See Comments)    myalgia  . Lovastatin Other (See Comments)    Myalgia  . Tramadol     Lips numb  Past Surgical History:  Procedure Laterality Date  . ABDOMINAL HYSTERECTOMY     Still has ovaries. No cervix or uterus.  Marland Kitchen BREAST CYST ASPIRATION Right   . CTR    . PARATHYROIDECTOMY Right 09/19/2016   Procedure: EXCISION PARATHYROID ADENOMA;  Surgeon: Margaretha Sheffield, MD;  Location: [facility] ORS;  Service: ENT;  Laterality: Right;  . SINUS  SURGERY WITH INSTATRAK      Social History   Tobacco Use  . Smoking status: Never Smoker  . Smokeless tobacco: Never Used  Substance Use Topics  . Alcohol use: Yes    Comment: occ  . Drug use: No     Medication list has been reviewed and updated.  PHQ 2/9 Scores 10/03/2017  PHQ - 2 Score 0    Physical Exam  Constitutional: She is oriented to person, place, and time. She appears well-developed. No distress.  HENT:  Head: Normocephalic and atraumatic.  Neck: Normal range of motion. Neck supple. No thyromegaly present.  Cardiovascular: Normal rate, regular rhythm and normal heart sounds.  Pulmonary/Chest: Effort normal and breath sounds normal. No respiratory distress. She has no wheezes. She has no rales.  Musculoskeletal: She exhibits no edema.  Neurological: She is alert and oriented to person, place, and time.  Skin: Skin is warm and dry.  Skin not examined under breasts  Psychiatric: She has a normal mood and affect. Her behavior is normal. Thought content normal.  Nursing note and vitals reviewed.   BP (!) 152/84   Pulse 87   Ht 5\' 4"  (1.626 m)   Wt 233 lb (105.7 kg)   SpO2 94%   BMI 39.99 kg/m   Assessment and Plan: 1. Gastroesophageal reflux disease, esophagitis presence not specified Continue PPI - omeprazole (PRILOSEC) 20 MG capsule; Take 1 capsule (20 mg total) by mouth daily.  Dispense: 90 capsule; Refill: 3  2. Breast cancer screening - MM DIGITAL SCREENING BILATERAL; Future  3. Need for influenza vaccination - Flu Vaccine QUAD 36+ mos IM  4. History of multiple pulmonary nodules With recurrence of cough Recommend Pulmonary follow up if not improving  5. Hx of hematuria Workup negative Monitor for recurrence   Meds ordered this encounter  Medications  . omeprazole (PRILOSEC) 20 MG capsule    Sig: Take 1 capsule (20 mg total) by mouth daily.    Dispense:  90 capsule    Refill:  3    Partially dictated using Editor, commissioning. Any errors  are unintentional.  Halina Maidens, MD [facility]  10/03/2017

## 2017-11-16 ENCOUNTER — Ambulatory Visit: Payer: 59 | Admitting: Internal Medicine

## 2017-11-16 ENCOUNTER — Encounter: Payer: Self-pay | Admitting: Internal Medicine

## 2017-11-16 VITALS — BP 128/88 | HR 95 | Temp 100.0°F | Ht 64.0 in | Wt 230.0 lb

## 2017-11-16 DIAGNOSIS — J019 Acute sinusitis, unspecified: Secondary | ICD-10-CM | POA: Diagnosis not present

## 2017-11-16 MED ORDER — AMOXICILLIN-POT CLAVULANATE 875-125 MG PO TABS: 1.0000 | ORAL_TABLET | Freq: Two times a day (BID) | ORAL | 0 refills | Status: DC

## 2017-11-16 MED ORDER — HYDROCOD POLST-CPM POLST ER 10-8 MG/5ML PO SUER: 5.0000 mL | Freq: Two times a day (BID) | ORAL | 0 refills | Status: DC

## 2017-11-16 NOTE — Progress Notes (Signed)
Date:  11/16/2017   Name:  Diane Vasquez   DOB:  12-Jan-1956   MRN:  [id number]   Chief Complaint: Sinusitis (Started Saturday with drainage. Cough- yellow production and some blood in the morning. Body aches. Tempature changes. Forehead and cheek pressure. ) Sinusitis  This is a new problem. The current episode started in the past 7 days. Associated symptoms include congestion, coughing, shortness of breath, sinus pressure and a sore throat. Pertinent negatives include no chills.  She had sx start 4 days ago with runny nose but also headache, fever and body aches.  She did get a flu shot this year.    Review of Systems  Constitutional: Positive for fatigue. Negative for chills.  HENT: Positive for congestion, sinus pressure, sinus pain, sore throat and voice change.   Respiratory: Positive for cough and shortness of breath. Negative for chest tightness and wheezing.   Cardiovascular: Negative for chest pain and palpitations.  Gastrointestinal: Negative for abdominal pain, diarrhea and nausea.    Patient Active Problem List   Diagnosis Date Noted  . Hx of hematuria 10/01/2017  . Obesity (BMI 30-39.9) 08/04/2017  . Parathyroid adenoma 09/19/2016  . History of multiple pulmonary nodules 03/17/2016  . Chronic fatigue, unspecified 02/16/2016  . Familial benign pemphigus 02/16/2016  . Polyarthralgia 02/16/2016  . GERD (gastroesophageal reflux disease) 02/03/2016  . Allergic rhinitis 02/03/2016  . Hypercholesterolemia 02/19/2014    Prior to Admission medications   Medication Sig Start Date End Date Taking? Authorizing Provider  Ascorbic Acid (VITAMIN C) 1000 MG tablet Take 1,000 mg by mouth daily.    [provider]  Calcium Carbonate-Vit D-Min (CALCIUM 1200 PO) Take by mouth.    [provider]  dapsone 100 MG tablet Take 50 mg by mouth daily.  08/03/17   [provider]  fluocinonide ointment (LIDEX) 0.63 % Apply 1 application topically daily as  needed (rash).  05/25/16   [provider]  Ivermectin (SOOLANTRA) 1 % CREA Apply 1 application topically at bedtime.    [provider]  metroNIDAZOLE (METROGEL) 1 % gel Apply 1 application topically daily.     [provider]  naltrexone (DEPADE) 50 MG tablet Take 50 mg by mouth at bedtime. 03/06/17   [provider]  omeprazole (PRILOSEC) 20 MG capsule Take 1 capsule (20 mg total) by mouth daily. 10/03/17   Glean Hess, MD  pyridOXINE (VITAMIN B-6) 100 MG tablet Take 100 mg by mouth daily.    [provider]  tretinoin (RETIN-A) 0.025 % cream Apply 1 application topically daily.    [provider]    Allergies  Allergen Reactions  . Atorvastatin Other (See Comments)    myalgia  . Lovastatin Other (See Comments)    Myalgia  . Tramadol     Lips numb    Past Surgical History:  Procedure Laterality Date  . ABDOMINAL HYSTERECTOMY     Still has ovaries. No cervix or uterus.  Marland Kitchen BREAST CYST ASPIRATION Right   . COLONOSCOPY  2009  . CTR    . PARATHYROIDECTOMY Right 09/19/2016   Procedure: EXCISION PARATHYROID ADENOMA;  Surgeon: Margaretha Sheffield, MD;  Location: [facility] ORS;  Service: ENT;  Laterality: Right;  . SINUS SURGERY WITH INSTATRAK      Social History   Tobacco Use  . Smoking status: Never Smoker  . Smokeless tobacco: Never Used  Substance Use Topics  . Alcohol use: Yes    Comment: occ  . Drug use:  No     Medication list has been reviewed and updated.  PHQ 2/9 Scores 10/03/2017  PHQ - 2 Score 0    Physical Exam  Constitutional: She is oriented to person, place, and time. She appears well-developed and well-nourished.  HENT:  Right Ear: External ear and ear canal normal. Tympanic membrane is not erythematous and not retracted.  Left Ear: External ear and ear canal normal. Tympanic membrane is not erythematous and not retracted.  Nose: Right sinus exhibits maxillary sinus tenderness and frontal sinus tenderness.  Left sinus exhibits maxillary sinus tenderness and frontal sinus tenderness.  Mouth/Throat: Uvula is midline and mucous membranes are normal. No oral lesions. Posterior oropharyngeal erythema present. No oropharyngeal exudate.  Cardiovascular: Normal rate, regular rhythm and normal heart sounds.  Pulmonary/Chest: Breath sounds normal. She has no wheezes. She has no rales.  Lymphadenopathy:    She has no cervical adenopathy.  Neurological: She is alert and oriented to person, place, and time.    BP 128/88   Pulse 95   Temp 100 F (37.8 C) (Oral)   Ht 5\' 4"  (1.626 m)   Wt 230 lb (104.3 kg)   SpO2 90%   BMI 39.48 kg/m   Assessment and Plan: 1. Acute non-recurrent sinusitis, unspecified location Continue Advil, mucinex, fluids and rest May be influenza but too late for antivirals - amoxicillin-clavulanate (AUGMENTIN) 875-125 MG tablet; Take 1 tablet by mouth 2 (two) times daily.  Dispense: 20 tablet; Refill: 0 - chlorpheniramine-HYDROcodone (TUSSIONEX PENNKINETIC ER) 10-8 MG/5ML SUER; Take 5 mLs by mouth 2 (two) times daily.  Dispense: 115 mL; Refill: 0   Meds ordered this encounter  Medications  . amoxicillin-clavulanate (AUGMENTIN) 875-125 MG tablet    Sig: Take 1 tablet by mouth 2 (two) times daily.    Dispense:  20 tablet    Refill:  0  . chlorpheniramine-HYDROcodone (TUSSIONEX PENNKINETIC ER) 10-8 MG/5ML SUER    Sig: Take 5 mLs by mouth 2 (two) times daily.    Dispense:  115 mL    Refill:  0    Partially dictated using Editor, commissioning. Any errors are unintentional.  Halina Maidens, MD [facility]  11/16/2017

## 2017-11-21 ENCOUNTER — Other Ambulatory Visit: Payer: Self-pay

## 2017-11-21 MED ORDER — FLUCONAZOLE 150 MG PO TABS: 150.0000 mg | ORAL_TABLET | Freq: Once | ORAL | 0 refills | Status: DC

## 2017-11-21 NOTE — Progress Notes (Signed)
Patient called stating she was given antibiotics last week. She now has a yeast infections and needs something called in for it. Sent Diflucan 1 tablet with 0 RFs to pts pharmacy. Called and informed if not better after that then she will need OV to have evaluated.

## 2017-11-24 ENCOUNTER — Other Ambulatory Visit: Payer: Self-pay | Admitting: Internal Medicine

## 2017-11-24 ENCOUNTER — Telehealth: Payer: Self-pay

## 2017-11-24 MED ORDER — LEVOFLOXACIN 500 MG PO TABS: 500.0000 mg | ORAL_TABLET | Freq: Every day | ORAL | 0 refills | Status: AC

## 2017-11-24 NOTE — Telephone Encounter (Signed)
Patient called stating she was seen for sinus issues on the 24th. Will be finishing her antibiotic tomorrow but feels like no better. Still has congestion and yellow thick mucous. Anything else she can do?  Please Advise?

## 2017-11-24 NOTE — Telephone Encounter (Signed)
Patient informed of other antibiotics sent to [facility]. And to continue taking her mucinex d she has been taking since the beginning of sickness.

## 2017-11-24 NOTE — Telephone Encounter (Signed)
She should also take a decongestant like Sudafed twice a day for congestion.  I can send in another antibiotics as well.

## 2017-11-28 ENCOUNTER — Ambulatory Visit
Admission: RE | Admit: 2017-11-28 | Discharge: 2017-11-28 | Disposition: A | Discharge status: Home / Self Care | Payer: 59 | Source: Ambulatory Visit | Attending: Internal Medicine | Admitting: Internal Medicine

## 2017-11-28 DIAGNOSIS — Z1231 Encounter for screening mammogram for malignant neoplasm of breast: Secondary | ICD-10-CM | POA: Diagnosis not present

## 2017-11-28 DIAGNOSIS — Z1239 Encounter for other screening for malignant neoplasm of breast: Secondary | ICD-10-CM

## 2017-12-18 DIAGNOSIS — Z872 Personal history of diseases of the skin and subcutaneous tissue: Secondary | ICD-10-CM | POA: Diagnosis not present

## 2017-12-18 DIAGNOSIS — Z86018 Personal history of other benign neoplasm: Secondary | ICD-10-CM | POA: Diagnosis not present

## 2017-12-18 DIAGNOSIS — L570 Actinic keratosis: Secondary | ICD-10-CM | POA: Diagnosis not present

## 2017-12-18 DIAGNOSIS — L814 Other melanin hyperpigmentation: Secondary | ICD-10-CM | POA: Diagnosis not present

## 2018-03-05 DIAGNOSIS — E890 Postprocedural hypothyroidism: Secondary | ICD-10-CM | POA: Diagnosis not present

## 2018-03-06 IMAGING — CT CT ABD-PEL WO/W CM
2 of 6 series · 12 of 32 positions shown, 17 images · IV contrast (iopamidol)
Comparison: None.

CLINICAL DATA: Hematuria.  Gross hematuria microscopic hematuria.

EXAM:
CT ABDOMEN AND PELVIS WITHOUT AND WITH CONTRAST
TECHNIQUE: Multidetector CT imaging of the abdomen and pelvis was performed
following the standard protocol before and following the bolus
administration of intravenous contrast.
CONTRAST:  125mL [id number] IOPAMIDOL (0I59YS-8WW) INJECTION 61%

[Series 4: axial post · axial · 0.94mm/px · z∈[-970,-614]mm · 6 of 101 slices shown, 11 images]
[im 15/101  soft-tissue]
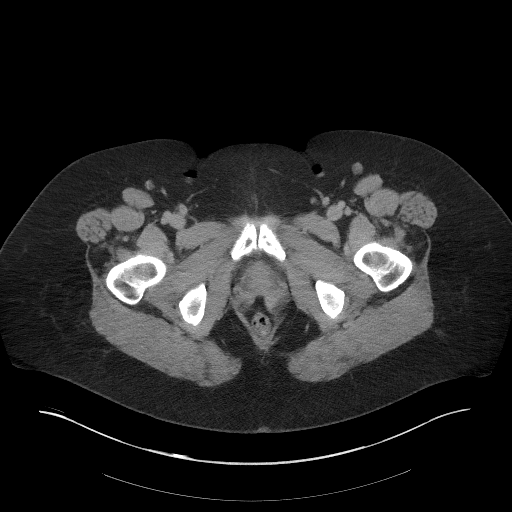
[im 15/101  bone]
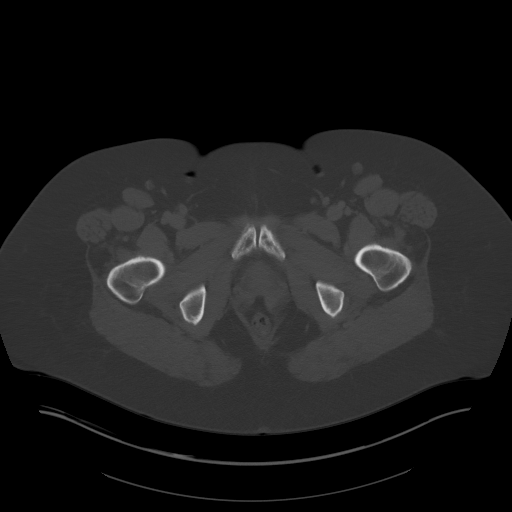
[im 29/101  soft-tissue]
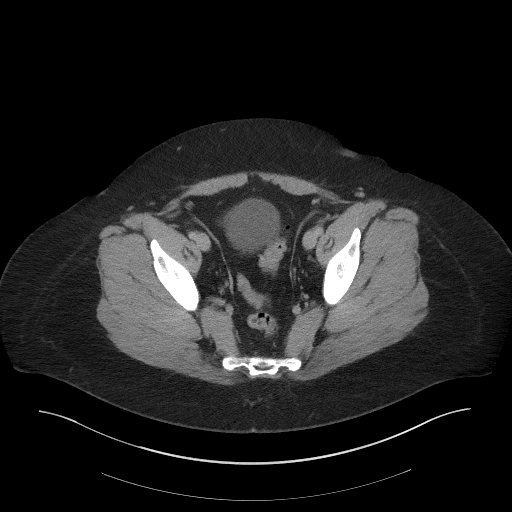
[im 43/101  soft-tissue]
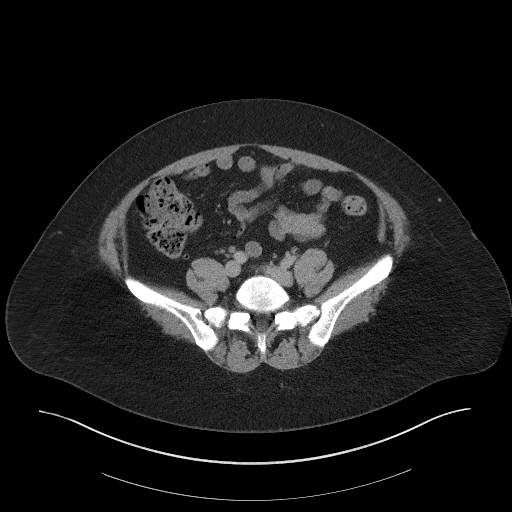
[im 43/101  lung]
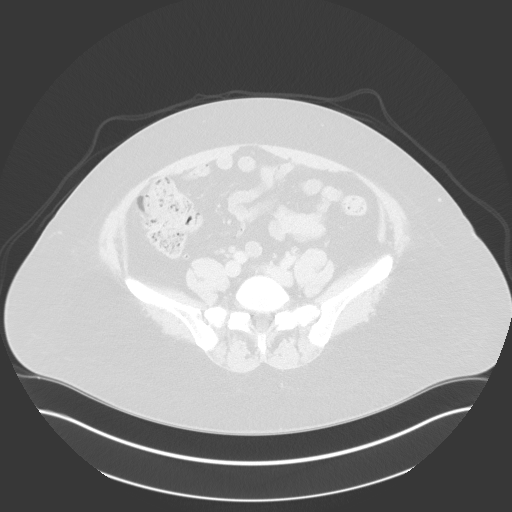
[im 58/101  soft-tissue]
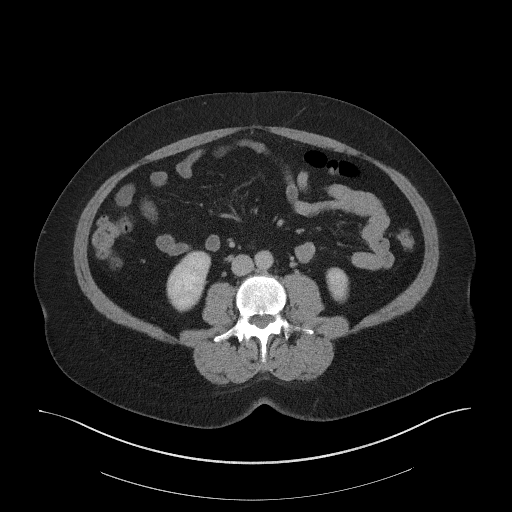
[im 58/101  lung]
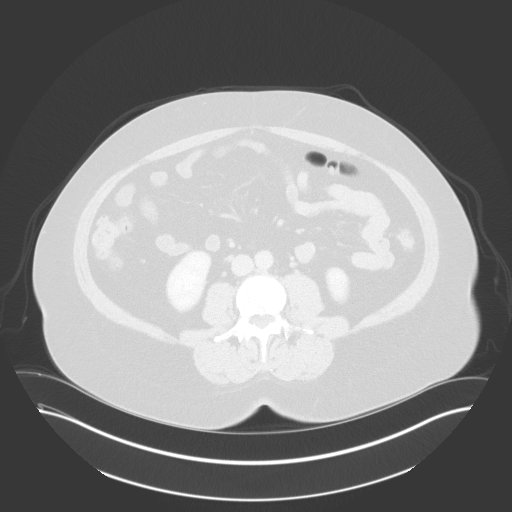
[im 72/101  soft-tissue]
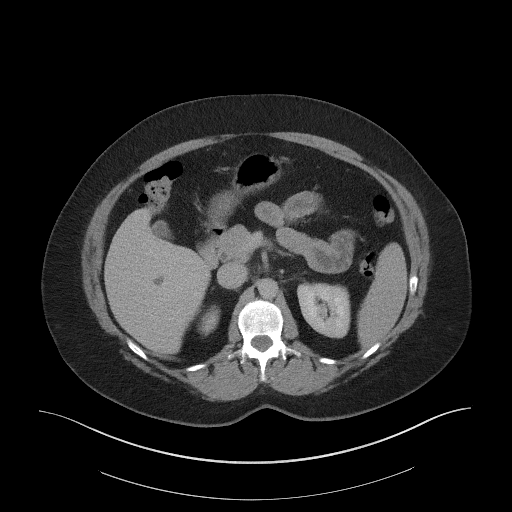
[im 72/101  lung]
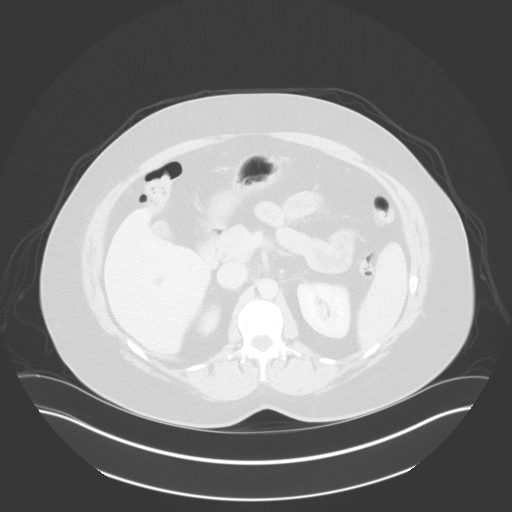
[im 86/101  soft-tissue]
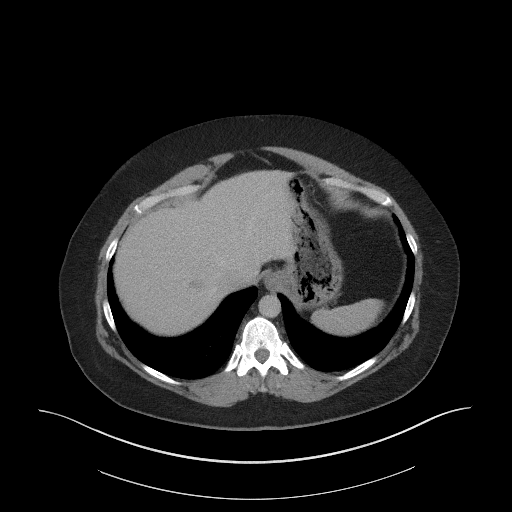
[im 86/101  lung]
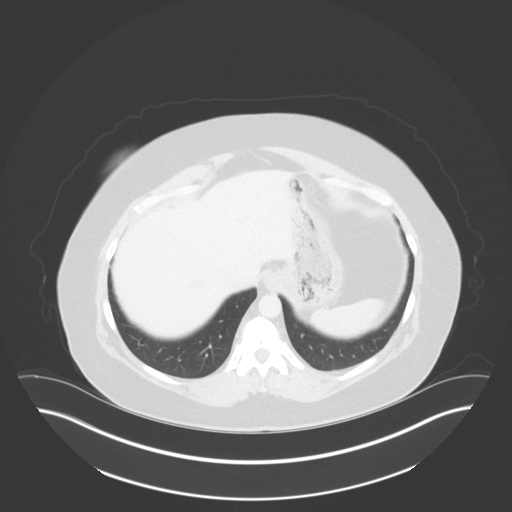

[Series 7: axial delay · axial · delayed · 0.94mm/px · z∈[-962,-607]mm · 6 of 101 slices shown]
[im 15/101  soft-tissue]
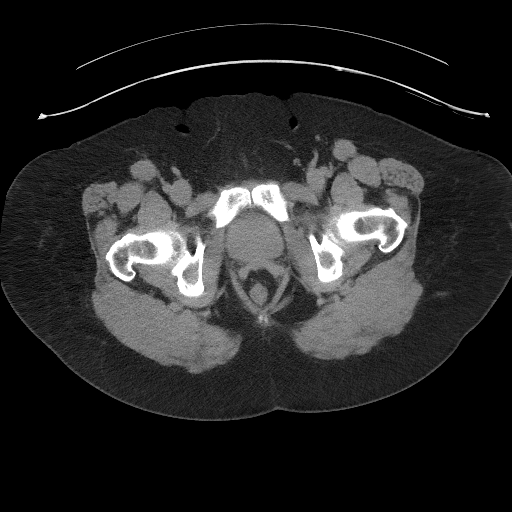
[im 29/101  soft-tissue]
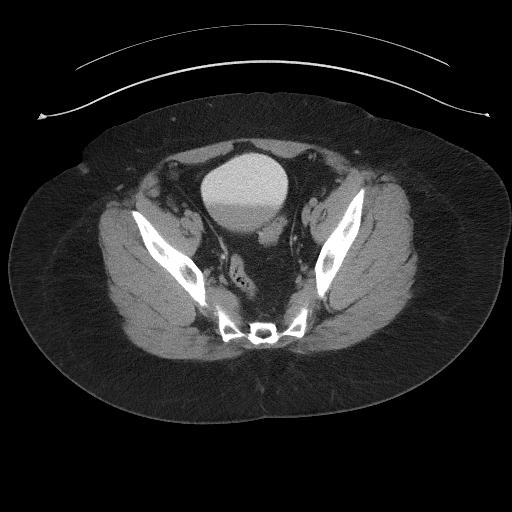
[im 43/101  soft-tissue]
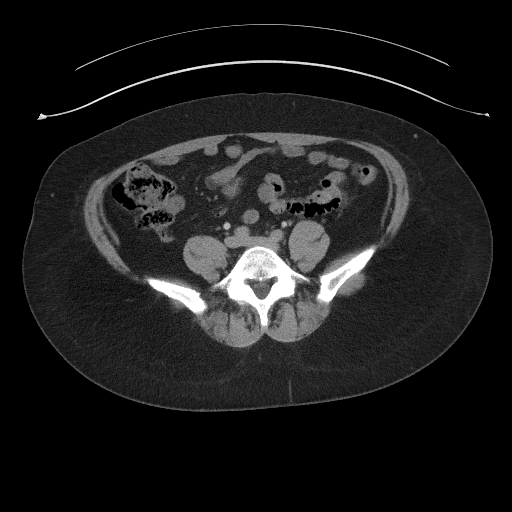
[im 58/101  soft-tissue]
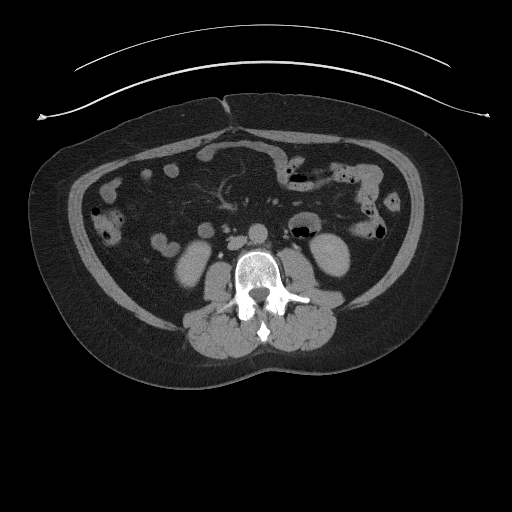
[im 72/101  soft-tissue]
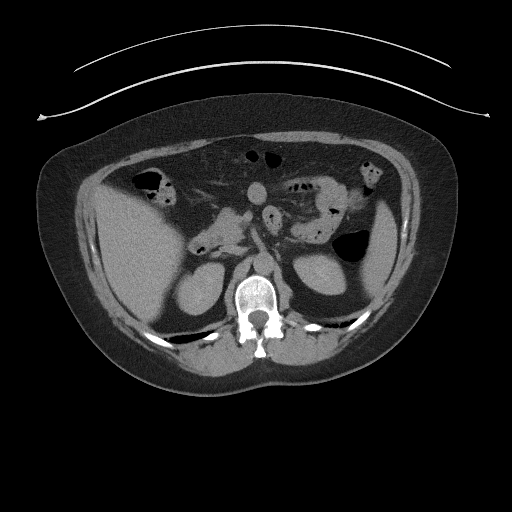
[im 86/101  soft-tissue]
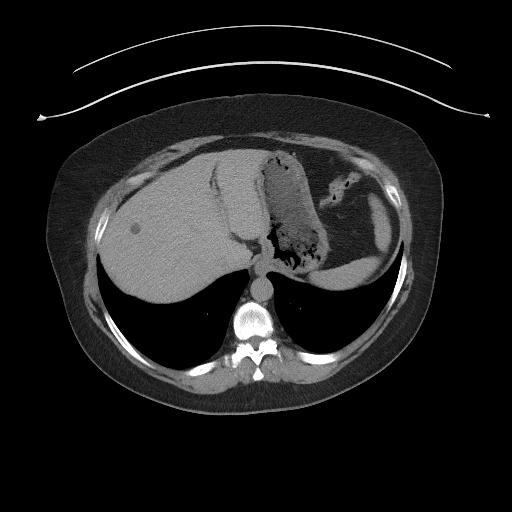

[12 of 32 positions shown; findings below may reference images not displayed]

FINDINGS: Lower chest: Lung bases are clear.

Hepatobiliary: No focal hepatic lesion. No biliary duct dilatation.
Gallbladder is normal. Common bile duct is normal.

Pancreas: Pancreas is normal. No ductal dilatation. No pancreatic
inflammation.

Spleen: Normal spleen

Adrenals/urinary tract: Calcifications within the LEFT adrenal gland
suggests prior hemorrhage or infection. Adrenal glands normal.

No nephrolithiasis or ureterolithiasis. Contrast enhanced imaging
demonstrates a wedge-shaped defect within the parenchyma of the mid
RIGHT kidney measuring 24 x 15 mm extending to the cortical surface
(image 36, series 4). On delayed imaging this hypodensity fills with
excreted contrast consistent with a caliceal diverticulum.

No enhancing renal cortical lesions. No filling defects within the
collecting systems or ureters.

No bladder calculi, enhancing bladder lesions, or filling defect
within the bladder.

Stomach/Bowel: Stomach, small bowel, appendix, and cecum are normal.
The colon and rectosigmoid colon are normal.

Vascular/Lymphatic: Abdominal aorta is normal caliber. There is no
retroperitoneal or periportal lymphadenopathy. No pelvic
lymphadenopathy.

Reproductive: Post hysterectomy.

Other: No free fluid.

Musculoskeletal: No aggressive osseous lesion.
IMPRESSION: 1. Large caliceal diverticulum within the mid cortex of the RIGHT
kidney.
2. No nephrolithiasis, ureterolithiasis, enhancing renal cortical
lesion, or filling defects within the collecting systems.
3. No bladder stones or filling defects in the bladder which does
not excluded a bladder lesion.

## 2018-03-15 DIAGNOSIS — Z483 Aftercare following surgery for neoplasm: Secondary | ICD-10-CM | POA: Diagnosis not present

## 2018-03-15 DIAGNOSIS — E890 Postprocedural hypothyroidism: Secondary | ICD-10-CM | POA: Diagnosis not present

## 2018-04-03 ENCOUNTER — Encounter: Payer: Self-pay | Admitting: Internal Medicine

## 2018-04-03 ENCOUNTER — Ambulatory Visit (INDEPENDENT_AMBULATORY_CARE_PROVIDER_SITE_OTHER): Payer: 59 | Admitting: Internal Medicine

## 2018-04-03 VITALS — BP 132/78 | HR 72 | Temp 98.1°F | Resp 16 | Ht 64.0 in | Wt 226.0 lb

## 2018-04-03 DIAGNOSIS — K219 Gastro-esophageal reflux disease without esophagitis: Secondary | ICD-10-CM | POA: Diagnosis not present

## 2018-04-03 DIAGNOSIS — Z79899 Other long term (current) drug therapy: Secondary | ICD-10-CM | POA: Diagnosis not present

## 2018-04-03 DIAGNOSIS — D351 Benign neoplasm of parathyroid gland: Secondary | ICD-10-CM | POA: Diagnosis not present

## 2018-04-03 DIAGNOSIS — E7800 Pure hypercholesterolemia, unspecified: Secondary | ICD-10-CM | POA: Diagnosis not present

## 2018-04-03 DIAGNOSIS — E559 Vitamin D deficiency, unspecified: Secondary | ICD-10-CM | POA: Insufficient documentation

## 2018-04-03 DIAGNOSIS — M79671 Pain in right foot: Secondary | ICD-10-CM

## 2018-04-03 DIAGNOSIS — Z0000 Encounter for general adult medical examination without abnormal findings: Secondary | ICD-10-CM | POA: Diagnosis not present

## 2018-04-03 DIAGNOSIS — Z1159 Encounter for screening for other viral diseases: Secondary | ICD-10-CM

## 2018-04-03 DIAGNOSIS — Z1239 Encounter for other screening for malignant neoplasm of breast: Secondary | ICD-10-CM

## 2018-04-03 LAB — POCT URINALYSIS DIPSTICK
Bilirubin, UA: NEGATIVE
Blood, UA: NEGATIVE
GLUCOSE UA: NEGATIVE
KETONES UA: NEGATIVE
Leukocytes, UA: NEGATIVE
Nitrite, UA: NEGATIVE
PH UA: 6.5 (ref 5.0–8.0)
Protein, UA: NEGATIVE
UROBILINOGEN UA: 0.2 U/dL

## 2018-04-03 MED ORDER — RANITIDINE HCL 150 MG PO TABS: 150.0000 mg | ORAL_TABLET | Freq: Two times a day (BID) | ORAL | 3 refills | Status: DC

## 2018-04-03 NOTE — Patient Instructions (Signed)
DASH Eating Plan DASH stands for "Dietary Approaches to Stop Hypertension." The DASH eating plan is a healthy eating plan that has been shown to reduce high blood pressure (hypertension). It may also reduce your risk for type 2 diabetes, heart disease, and stroke. The DASH eating plan may also help with weight loss. What are tips for following this plan? General guidelines  Avoid eating more than 2,300 mg (milligrams) of salt (sodium) a day. If you have hypertension, you may need to reduce your sodium intake to 1,500 mg a day.  Limit alcohol intake to no more than 1 drink a day for nonpregnant women and 2 drinks a day for men. One drink equals 12 oz of beer, 5 oz of wine, or 1 oz of hard liquor.  Work with your health care provider to maintain a healthy body weight or to lose weight. Ask what an ideal weight is for you.  Get at least 30 minutes of exercise that causes your heart to beat faster (aerobic exercise) most days of the week. Activities may include walking, swimming, or biking.  Work with your health care provider or diet and nutrition specialist (dietitian) to adjust your eating plan to your individual calorie needs. Reading food labels  Check food labels for the amount of sodium per serving. Choose foods with less than 5 percent of the Daily Value of sodium. Generally, foods with less than 300 mg of sodium per serving fit into this eating plan.  To find whole grains, look for the word "whole" as the first word in the ingredient list. Shopping  Buy products labeled as "low-sodium" or "no salt added."  Buy fresh foods. Avoid canned foods and premade or frozen meals. Cooking  Avoid adding salt when cooking. Use salt-free seasonings or herbs instead of table salt or sea salt. Check with your health care provider or pharmacist before using salt substitutes.  Do not fry foods. Cook foods using healthy methods such as baking, boiling, grilling, and broiling instead.  Cook with  heart-healthy oils, such as olive, canola, soybean, or sunflower oil. Meal planning   Eat a balanced diet that includes: ? 5 or more servings of fruits and vegetables each day. At each meal, try to fill half of your plate with fruits and vegetables. ? Up to 6-8 servings of whole grains each day. ? Less than 6 oz of lean meat, poultry, or fish each day. A 3-oz serving of meat is about the same size as a deck of cards. One egg equals 1 oz. ? 2 servings of low-fat dairy each day. ? A serving of nuts, seeds, or beans 5 times each week. ? Heart-healthy fats. Healthy fats called Omega-3 fatty acids are found in foods such as flaxseeds and coldwater fish, like sardines, salmon, and mackerel.  Limit how much you eat of the following: ? Canned or prepackaged foods. ? Food that is high in trans fat, such as fried foods. ? Food that is high in saturated fat, such as fatty meat. ? Sweets, desserts, sugary drinks, and other foods with added sugar. ? Full-fat dairy products.  Do not salt foods before eating.  Try to eat at least 2 vegetarian meals each week.  Eat more home-cooked food and less restaurant, buffet, and fast food.  When eating at a restaurant, ask that your food be prepared with less salt or no salt, if possible. What foods are recommended? The items listed may not be a complete list. Talk with your dietitian about what   dietary choices are best for you. Grains Whole-grain or whole-wheat bread. Whole-grain or whole-wheat pasta. Brown rice. Oatmeal. Quinoa. Bulgur. Whole-grain and low-sodium cereals. Pita bread. Low-fat, low-sodium crackers. Whole-wheat flour tortillas. Vegetables Fresh or frozen vegetables (raw, steamed, roasted, or grilled). Low-sodium or reduced-sodium tomato and vegetable juice. Low-sodium or reduced-sodium tomato sauce and tomato paste. Low-sodium or reduced-sodium canned vegetables. Fruits All fresh, dried, or frozen fruit. Canned fruit in natural juice (without  added sugar). Meat and other protein foods Skinless chicken or turkey. Ground chicken or turkey. Pork with fat trimmed off. Fish and seafood. Egg whites. Dried beans, peas, or lentils. Unsalted nuts, nut butters, and seeds. Unsalted canned beans. Lean cuts of beef with fat trimmed off. Low-sodium, lean deli meat. Dairy Low-fat (1%) or fat-free (skim) milk. Fat-free, low-fat, or reduced-fat cheeses. Nonfat, low-sodium ricotta or cottage cheese. Low-fat or nonfat yogurt. Low-fat, low-sodium cheese. Fats and oils Soft margarine without trans fats. Vegetable oil. Low-fat, reduced-fat, or light mayonnaise and salad dressings (reduced-sodium). Canola, safflower, olive, soybean, and sunflower oils. Avocado. Seasoning and other foods Herbs. Spices. Seasoning mixes without salt. Unsalted popcorn and pretzels. Fat-free sweets. What foods are not recommended? The items listed may not be a complete list. Talk with your dietitian about what dietary choices are best for you. Grains Baked goods made with fat, such as croissants, muffins, or some breads. Dry pasta or rice meal packs. Vegetables Creamed or fried vegetables. Vegetables in a cheese sauce. Regular canned vegetables (not low-sodium or reduced-sodium). Regular canned tomato sauce and paste (not low-sodium or reduced-sodium). Regular tomato and vegetable juice (not low-sodium or reduced-sodium). Pickles. Olives. Fruits Canned fruit in a light or heavy syrup. Fried fruit. Fruit in cream or butter sauce. Meat and other protein foods Fatty cuts of meat. Ribs. Fried meat. Bacon. Sausage. Bologna and other processed lunch meats. Salami. Fatback. Hotdogs. Bratwurst. Salted nuts and seeds. Canned beans with added salt. Canned or smoked fish. Whole eggs or egg yolks. Chicken or turkey with skin. Dairy Whole or 2% milk, cream, and half-and-half. Whole or full-fat cream cheese. Whole-fat or sweetened yogurt. Full-fat cheese. Nondairy creamers. Whipped toppings.  Processed cheese and cheese spreads. Fats and oils Butter. Stick margarine. Lard. Shortening. Ghee. Bacon fat. Tropical oils, such as coconut, palm kernel, or palm oil. Seasoning and other foods Salted popcorn and pretzels. Onion salt, garlic salt, seasoned salt, table salt, and sea salt. Worcestershire sauce. Tartar sauce. Barbecue sauce. Teriyaki sauce. Soy sauce, including reduced-sodium. Steak sauce. Canned and packaged gravies. Fish sauce. Oyster sauce. Cocktail sauce. Horseradish that you find on the shelf. Ketchup. Mustard. Meat flavorings and tenderizers. Bouillon cubes. Hot sauce and Tabasco sauce. Premade or packaged marinades. Premade or packaged taco seasonings. Relishes. Regular salad dressings. Where to find more information:  National Heart, Lung, and Blood Institute: www.nhlbi.nih.gov  American Heart Association: www.heart.org Summary  The DASH eating plan is a healthy eating plan that has been shown to reduce high blood pressure (hypertension). It may also reduce your risk for type 2 diabetes, heart disease, and stroke.  With the DASH eating plan, you should limit salt (sodium) intake to 2,300 mg a day. If you have hypertension, you may need to reduce your sodium intake to 1,500 mg a day.  When on the DASH eating plan, aim to eat more fresh fruits and vegetables, whole grains, lean proteins, low-fat dairy, and heart-healthy fats.  Work with your health care provider or diet and nutrition specialist (dietitian) to adjust your eating plan to your individual   calorie needs. This information is not intended to replace advice given to you by your health care provider. Make sure you discuss any questions you have with your health care provider. Document Released: 09/29/2011 Document Revised: 10/03/2016 Document Reviewed: 10/03/2016 Elsevier Interactive Patient Education  2018 [employer]  

## 2018-04-03 NOTE — Progress Notes (Signed)
Date:  04/03/2018   Name:  Diane Vasquez   DOB:  1955/11/19   MRN:  [id number]   Chief Complaint: Annual Exam Diane Vasquez is a 62 y.o. female who presents today for her Complete Annual Exam. She feels fairly well. She reports exercising walking daily 15-30 min. She reports she is sleeping well.  She recently completed a mammogram.  She is due for colonoscopy next year (10 yr follow up).  Gastroesophageal Reflux  She complains of heartburn. She reports no abdominal pain, no chest pain, no coughing, no hoarse voice, no sore throat, no stridor or no wheezing. This is a recurrent problem. The problem occurs frequently. Pertinent negatives include no fatigue. She has tried a PPI, a histamine-2 antagonist and a diet change for the symptoms. The treatment provided significant relief.  Foot Injury   There was no injury mechanism. The pain is present in the right foot (at base of 3rd and 4th toes). The pain has been fluctuating since onset. She has tried non-weight bearing for the symptoms.   Skin changes - she has chronic waxing and waning skin issues treated by Dermatology.  Currently on Dapsone and trying to wean off.  No longer on naltrexone.  Still using topical agents as needed for flares of skin lesions.  She recently saw Dr. Kathyrn Sheriff for follow up of thyroid and low vitamin D.  Now on vitamin D 2000 IU daily.    Review of Systems  Constitutional: Negative for chills, fatigue and fever.  HENT: Negative for hoarse voice, sore throat and trouble swallowing.   Respiratory: Negative for cough, chest tightness, shortness of breath and wheezing.   Cardiovascular: Negative for chest pain, palpitations and leg swelling.  Gastrointestinal: Positive for heartburn. Negative for abdominal pain, constipation and diarrhea.  Genitourinary: Negative for difficulty urinating, dysuria and hematuria.  Musculoskeletal: Positive for arthralgias (right foot).  Skin:       Intermittent skin changes from  rosacea, phemphigus  Hematological: Negative for adenopathy.  Psychiatric/Behavioral: Negative for dysphoric mood and sleep disturbance. The patient is not nervous/anxious.     Patient Active Problem List   Diagnosis Date Noted  . Hx of hematuria 10/01/2017  . Obesity (BMI 30-39.9) 08/04/2017  . Parathyroid adenoma 09/19/2016  . History of multiple pulmonary nodules 03/17/2016  . Chronic fatigue, unspecified 02/16/2016  . Familial benign pemphigus 02/16/2016  . Polyarthralgia 02/16/2016  . GERD (gastroesophageal reflux disease) 02/03/2016  . Allergic rhinitis 02/03/2016  . Hypercholesterolemia 02/19/2014    Prior to Admission medications   Medication Sig Start Date End Date Taking? Authorizing Provider  Ascorbic Acid (VITAMIN C) 1000 MG tablet Take 1,000 mg by mouth daily.   Yes [provider]  Calcium Carbonate-Vit D-Min (CALCIUM 1200 PO) Take by mouth.   Yes [provider]  [employer] D3 2000 units CAPS Take 1,000 Units by mouth 2 (two) times daily.  03/21/18  Yes [provider]  dapsone 100 MG tablet Take 50 mg by mouth daily.  08/03/17  Yes [provider]  fluocinonide ointment (LIDEX) 4.08 % Apply 1 application topically daily as needed (rash).  05/25/16  Yes [provider]  Ivermectin (SOOLANTRA) 1 % CREA Apply 1 application topically at bedtime.   Yes [provider]  metroNIDAZOLE (METROGEL) 1 % gel Apply 1 application topically daily.    Yes [provider]  naltrexone (DEPADE) 50 MG tablet Take 50 mg by mouth at bedtime. 03/06/17  Yes [provider]  omeprazole (  PRILOSEC) 20 MG capsule Take 1 capsule (20 mg total) by mouth daily. 10/03/17  Yes Glean Hess, MD  pyridOXINE (VITAMIN B-6) 100 MG tablet Take 100 mg by mouth daily.   Yes [provider]  tretinoin (RETIN-A) 0.025 % cream Apply 1 application topically daily.   Yes [provider]    Allergies  Allergen Reactions  .  Atorvastatin Other (See Comments)    myalgia  . Lovastatin Other (See Comments)    Myalgia  . Tramadol     Lips numb    Past Surgical History:  Procedure Laterality Date  . ABDOMINAL HYSTERECTOMY     Still has ovaries. No cervix or uterus.  Marland Kitchen BREAST CYST ASPIRATION Right    neg  . COLONOSCOPY  2009  . [facility]    . PARATHYROIDECTOMY Right 09/19/2016   Procedure: EXCISION PARATHYROID ADENOMA;  Surgeon: Margaretha Sheffield, MD;  Location: [facility] ORS;  Service: ENT;  Laterality: Right;  . SINUS SURGERY WITH INSTATRAK      Social History   Tobacco Use  . Smoking status: Never Smoker  . Smokeless tobacco: Never Used  Substance Use Topics  . Alcohol use: Yes    Comment: occ  . Drug use: No     Medication list has been reviewed and updated.  Current Meds  Medication Sig  . Ascorbic Acid (VITAMIN C) 1000 MG tablet Take 1,000 mg by mouth daily.  . Calcium Carbonate-Vit D-Min (CALCIUM 1200 PO) Take by mouth.  . CVS D3 2000 units CAPS Take 1,000 Units by mouth 2 (two) times daily.   . dapsone 100 MG tablet Take 50 mg by mouth daily.   . fluocinonide ointment (LIDEX) 4.09 % Apply 1 application topically daily as needed (rash).   . Ivermectin (SOOLANTRA) 1 % CREA Apply 1 application topically at bedtime.  . metroNIDAZOLE (METROGEL) 1 % gel Apply 1 application topically daily.   . naltrexone (DEPADE) 50 MG tablet Take 50 mg by mouth at bedtime.  Marland Kitchen omeprazole (PRILOSEC) 20 MG capsule Take 1 capsule (20 mg total) by mouth daily.  Marland Kitchen pyridOXINE (VITAMIN B-6) 100 MG tablet Take 100 mg by mouth daily.  Marland Kitchen tretinoin (RETIN-A) 0.025 % cream Apply 1 application topically daily.    PHQ 2/9 Scores 04/03/2018 10/03/2017  PHQ - 2 Score 0 0    Physical Exam  Constitutional: She is oriented to person, place, and time. She appears well-developed and well-nourished. No distress.  HENT:  Head: Normocephalic and atraumatic.  Right Ear: Tympanic membrane and ear canal normal.  Left Ear: Tympanic membrane  and ear canal normal.  Nose: Right sinus exhibits no maxillary sinus tenderness. Left sinus exhibits no maxillary sinus tenderness.  Mouth/Throat: Uvula is midline and oropharynx is clear and moist.  Eyes: Conjunctivae and EOM are normal. Right eye exhibits no discharge. Left eye exhibits no discharge. No scleral icterus.  Neck: Normal range of motion. Carotid bruit is not present. No erythema present. No thyromegaly present.  Cardiovascular: Normal rate, regular rhythm, normal heart sounds and normal pulses.  Pulmonary/Chest: Effort normal and breath sounds normal. No respiratory distress. She has no wheezes. Right breast exhibits no mass, no nipple discharge, no skin change and no tenderness. Left breast exhibits no mass, no nipple discharge, no skin change and no tenderness.  Abdominal: Soft. Bowel sounds are normal. There is no hepatosplenomegaly. There is no tenderness. There is no CVA tenderness.  Musculoskeletal: Normal range of motion.       Right ankle: She  exhibits normal range of motion, no swelling, no ecchymosis and no deformity. Tenderness (at base of 3rd and 4th toes).  Lymphadenopathy:    She has no cervical adenopathy.    She has no axillary adenopathy.  Neurological: She is alert and oriented to person, place, and time. She has normal reflexes. No cranial nerve deficit or sensory deficit.  Skin: Skin is warm, dry and intact. No rash noted.  Psychiatric: She has a normal mood and affect. Her speech is normal and behavior is normal. Thought content normal.  Nursing note and vitals reviewed.   BP 132/78   Pulse 72   Temp 98.1 F (36.7 C) (Oral)   Resp 16   Ht 5\' 4"  (1.626 m)   Wt 226 lb (102.5 kg)   SpO2 94%   BMI 38.79 kg/m   Assessment and Plan: 1. Annual physical exam Normal exam except for weight Continue exercise as able, healthy diet - POCT urinalysis dipstick  2. Breast cancer screening Mammogram done 11/2017  3. Gastroesophageal reflux disease, esophagitis  presence not specified Stop omeprazole over concerns of side effects/vitamin depletion - CBC with Differential/Platelet - ranitidine (ZANTAC) 150 MG tablet; Take 1 tablet (150 mg total) by mouth 2 (two) times daily.  Dispense: 180 tablet; Refill: 3  4. Hypercholesterolemia Check labs fasting - Lipid panel  5. Need for hepatitis C screening test - Hepatitis C antibody  6. Long term current use of therapeutic drug Dapsone prescribed by Dermatology - CBC with Differential/Platelet - Comprehensive metabolic panel  7. Foot pain, right Recommend Advil or tylenol bid for a week If no improvement, recommend Podiatry evaluation  8. Vitamin D deficiency supplemented  9. Parathyroid adenoma Followed by Dr. Kathyrn Sheriff since resection   Meds ordered this encounter  Medications  . ranitidine (ZANTAC) 150 MG tablet    Sig: Take 1 tablet (150 mg total) by mouth 2 (two) times daily.    Dispense:  180 tablet    Refill:  3    Partially dictated using Editor, commissioning. Any errors are unintentional.  Halina Maidens, MD [facility]  04/03/2018  There are no diagnoses linked to this encounter.

## 2018-04-04 DIAGNOSIS — K219 Gastro-esophageal reflux disease without esophagitis: Secondary | ICD-10-CM | POA: Diagnosis not present

## 2018-04-04 DIAGNOSIS — E7800 Pure hypercholesterolemia, unspecified: Secondary | ICD-10-CM | POA: Diagnosis not present

## 2018-04-04 DIAGNOSIS — Z79899 Other long term (current) drug therapy: Secondary | ICD-10-CM | POA: Diagnosis not present

## 2018-04-05 LAB — CBC WITH DIFFERENTIAL/PLATELET
BASOS ABS: 0.1 10*3/uL (ref 0.0–0.2)
BASOS: 2 %
EOS (ABSOLUTE): 0.1 10*3/uL (ref 0.0–0.4)
Eos: 3 %
HEMOGLOBIN: 12.9 g/dL (ref 11.1–15.9)
Hematocrit: 40.6 % (ref 34.0–46.6)
IMMATURE GRANS (ABS): 0 10*3/uL (ref 0.0–0.1)
IMMATURE GRANULOCYTES: 1 %
LYMPHS: 40 %
Lymphocytes Absolute: 1.8 10*3/uL (ref 0.7–3.1)
MCH: 32.9 pg (ref 26.6–33.0)
MCHC: 31.8 g/dL (ref 31.5–35.7)
MCV: 104 fL — ABNORMAL HIGH (ref 79–97)
MONOCYTES: 9 %
Monocytes Absolute: 0.4 10*3/uL (ref 0.1–0.9)
NEUTROS ABS: 2.1 10*3/uL (ref 1.4–7.0)
NEUTROS PCT: 45 %
PLATELETS: 217 10*3/uL (ref 150–450)
RBC: 3.92 x10E6/uL (ref 3.77–5.28)
RDW: 13.4 % (ref 12.3–15.4)
WBC: 4.5 10*3/uL (ref 3.4–10.8)

## 2018-04-05 LAB — COMPREHENSIVE METABOLIC PANEL
ALBUMIN: 4.5 g/dL (ref 3.5–5.5)
ALT: 22 IU/L (ref 0–32)
AST: 20 IU/L (ref 0–40)
Albumin/Globulin Ratio: 2.3 — ABNORMAL HIGH (ref 1.2–2.2)
Alkaline Phosphatase: 78 IU/L (ref 39–117)
BUN / CREAT RATIO: 15 (ref 9–23)
BUN: 11 mg/dL (ref 6–24)
Bilirubin Total: 0.5 mg/dL (ref 0.0–1.2)
CALCIUM: 9.1 mg/dL (ref 8.7–10.2)
CO2: 23 mmol/L (ref 20–29)
CREATININE: 0.74 mg/dL (ref 0.57–1.00)
Chloride: 105 mmol/L (ref 96–106)
GFR, EST AFRICAN AMERICAN: 103 mL/min/{1.73_m2} (ref >59)
GFR, EST NON AFRICAN AMERICAN: 89 mL/min/{1.73_m2} (ref >59)
Globulin, Total: 2 g/dL (ref 1.5–4.5)
Glucose: 77 mg/dL (ref 65–99)
Potassium: 4 mmol/L (ref 3.5–5.2)
Sodium: 146 mmol/L — ABNORMAL HIGH (ref 134–144)
TOTAL PROTEIN: 6.5 g/dL (ref 6.0–8.5)

## 2018-04-05 LAB — LIPID PANEL
CHOL/HDL RATIO: 4.3 ratio (ref 0.0–4.4)
Cholesterol, Total: 196 mg/dL (ref 100–199)
HDL: 46 mg/dL (ref >39)
LDL CALC: 125 mg/dL — AB (ref 0–99)
Triglycerides: 126 mg/dL (ref 0–149)
VLDL CHOLESTEROL CAL: 25 mg/dL (ref 5–40)

## 2018-04-05 LAB — HEPATITIS C ANTIBODY

## 2018-04-17 ENCOUNTER — Encounter: Payer: Self-pay | Admitting: Internal Medicine

## 2018-06-08 ENCOUNTER — Ambulatory Visit
Admission: RE | Admit: 2018-06-08 | Discharge: 2018-06-08 | Disposition: A | Discharge status: Home / Self Care | Payer: 59 | Source: Ambulatory Visit | Attending: Internal Medicine | Admitting: Internal Medicine

## 2018-06-08 ENCOUNTER — Encounter: Payer: Self-pay | Admitting: Internal Medicine

## 2018-06-08 ENCOUNTER — Ambulatory Visit: Payer: 59 | Admitting: Internal Medicine

## 2018-06-08 VITALS — BP 142/84 | HR 77 | Ht 64.0 in | Wt 230.0 lb

## 2018-06-08 DIAGNOSIS — M19071 Primary osteoarthritis, right ankle and foot: Secondary | ICD-10-CM | POA: Diagnosis not present

## 2018-06-08 DIAGNOSIS — M79671 Pain in right foot: Secondary | ICD-10-CM | POA: Diagnosis not present

## 2018-06-08 DIAGNOSIS — M25571 Pain in right ankle and joints of right foot: Secondary | ICD-10-CM | POA: Diagnosis not present

## 2018-06-08 NOTE — Progress Notes (Signed)
Date:  06/08/2018   Name:  Diane Vasquez   DOB:  [birth year]   MRN:  [id number]   Chief Complaint: Foot Pain (Stepped in a hole Sunday afternoon. Limping while walking. Kept elevated the day after. After walking on it and working yesterday. Foot is swelling and pain on inside. )  Foot Pain  This is a new problem. The current episode started in the past 7 days. The problem occurs constantly. The problem has been gradually worsening. Associated symptoms include arthralgias and joint swelling. Pertinent negatives include no chest pain, chills, coughing, fatigue or fever.   Stepped in hole at home while feeding her horse on 06/03/18.  She twisted her ankle and knocked the breath out of herself.   Review of Systems  Constitutional: Negative for chills, fatigue and fever.  Respiratory: Negative for cough, chest tightness, shortness of breath and wheezing.   Cardiovascular: Negative for chest pain and palpitations.  Musculoskeletal: Positive for arthralgias and joint swelling.    Patient Active Problem List   Diagnosis Date Noted  . Foot pain, right 04/03/2018  . Vitamin D deficiency 04/03/2018  . Hx of hematuria 10/01/2017  . Obesity (BMI 30-39.9) 08/04/2017  . Parathyroid adenoma 09/19/2016  . History of multiple pulmonary nodules 03/17/2016  . Chronic fatigue, unspecified 02/16/2016  . Familial benign pemphigus 02/16/2016  . Polyarthralgia 02/16/2016  . GERD (gastroesophageal reflux disease) 02/03/2016  . Allergic rhinitis 02/03/2016  . Hypercholesterolemia 02/19/2014    Allergies  Allergen Reactions  . Atorvastatin Other (See Comments)    myalgia  . Lovastatin Other (See Comments)    Myalgia  . Tramadol     Lips numb    Past Surgical History:  Procedure Laterality Date  . ABDOMINAL HYSTERECTOMY     Still has ovaries. No cervix or uterus.  Marland Kitchen BREAST CYST ASPIRATION Right    neg  . COLONOSCOPY  11/02/2009   normal - repeat 10 yrs  . [facility]    . PARATHYROIDECTOMY  Right 09/19/2016   Procedure: EXCISION PARATHYROID ADENOMA;  Surgeon: Margaretha Sheffield, MD;  Location: [facility] ORS;  Service: ENT;  Laterality: Right;  . SINUS SURGERY WITH INSTATRAK      Social History   Tobacco Use  . Smoking status: Never Smoker  . Smokeless tobacco: Never Used  Substance Use Topics  . Alcohol use: Yes    Comment: occ  . Drug use: No     Medication list has been reviewed and updated.  Current Meds  Medication Sig  . Ascorbic Acid (VITAMIN C) 1000 MG tablet Take 1,000 mg by mouth daily.  . Calcium Carbonate-Vit D-Min (CALCIUM 1200 PO) Take by mouth.  . CVS D3 2000 units CAPS Take 1,000 Units by mouth 2 (two) times daily.   . dapsone 100 MG tablet Take 50 mg by mouth daily.   . fluocinonide ointment (LIDEX) 3.78 % Apply 1 application topically daily as needed (rash).   . Ivermectin (SOOLANTRA) 1 % CREA Apply 1 application topically at bedtime.  . metroNIDAZOLE (METROGEL) 1 % gel Apply 1 application topically daily.   Marland Kitchen pyridOXINE (VITAMIN B-6) 100 MG tablet Take 100 mg by mouth daily.  . ranitidine (ZANTAC) 150 MG tablet Take 1 tablet (150 mg total) by mouth 2 (two) times daily.  Marland Kitchen tretinoin (RETIN-A) 0.025 % cream Apply 1 application topically daily.    PHQ 2/9 Scores 04/03/2018 10/03/2017  PHQ - 2 Score 0 0    Physical Exam  Constitutional: She is oriented to  person, place, and time. She appears well-developed. No distress.  HENT:  Head: Normocephalic and atraumatic.  Eyes: Pupils are equal, round, and reactive to light.  Neck: Normal range of motion.  Cardiovascular: Normal rate, regular rhythm and normal heart sounds.  Pulmonary/Chest: Effort normal and breath sounds normal. No respiratory distress.  Musculoskeletal: Normal range of motion.       Right ankle: She exhibits swelling and ecchymosis. Tenderness.       Feet:  Neurological: She is alert and oriented to person, place, and time.  Skin: Skin is warm and dry. No rash noted.  Psychiatric: She has  a normal mood and affect. Her behavior is normal. Thought content normal.  Nursing note and vitals reviewed.   BP (!) 142/84   Pulse 77   Ht 5\' 4"  (1.626 m)   Wt 230 lb (104.3 kg)   SpO2 95%   BMI 39.48 kg/m   Assessment and Plan: 1. Foot pain, right Continue ice, elevation, ACE wrap and Advil - DG Foot Complete Right; Future - DG Ankle Complete Right; Future   No orders of the defined types were placed in this encounter.   Partially dictated using Editor, commissioning. Any errors are unintentional.  Halina Maidens, MD [facility]  06/08/2018

## 2018-06-09 ENCOUNTER — Encounter: Payer: Self-pay | Admitting: Internal Medicine

## 2018-06-26 DIAGNOSIS — L578 Other skin changes due to chronic exposure to nonionizing radiation: Secondary | ICD-10-CM | POA: Diagnosis not present

## 2018-06-26 DIAGNOSIS — Z1283 Encounter for screening for malignant neoplasm of skin: Secondary | ICD-10-CM | POA: Diagnosis not present

## 2018-06-26 DIAGNOSIS — L02419 Cutaneous abscess of limb, unspecified: Secondary | ICD-10-CM | POA: Diagnosis not present

## 2018-06-26 DIAGNOSIS — Z85820 Personal history of malignant melanoma of skin: Secondary | ICD-10-CM | POA: Diagnosis not present

## 2018-06-28 DIAGNOSIS — Q828 Other specified congenital malformations of skin: Secondary | ICD-10-CM | POA: Diagnosis not present

## 2018-08-13 ENCOUNTER — Ambulatory Visit: Payer: 59 | Admitting: Internal Medicine

## 2018-08-13 ENCOUNTER — Encounter: Payer: Self-pay | Admitting: Internal Medicine

## 2018-08-13 VITALS — BP 116/78 | HR 70 | Temp 97.7°F | Ht 64.0 in | Wt 231.0 lb

## 2018-08-13 DIAGNOSIS — J0140 Acute pansinusitis, unspecified: Secondary | ICD-10-CM | POA: Diagnosis not present

## 2018-08-13 MED ORDER — AMOXICILLIN-POT CLAVULANATE 875-125 MG PO TABS: 1.0000 | ORAL_TABLET | Freq: Two times a day (BID) | ORAL | 0 refills | Status: AC

## 2018-08-13 NOTE — Progress Notes (Signed)
Date:  08/13/2018   Name:  Diane Vasquez   DOB:  Sep 26, 1956   MRN:  [id number]   Chief Complaint: Sinusitis (Started Sat. Tried mucinex D. Facial pressure, throat sore, headache. No fever. Coughed up some yellow with little blood. )  Sinusitis  This is a new problem. The current episode started in the past 7 days. The problem has been gradually worsening since onset. There has been no fever. The pain is mild. Associated symptoms include congestion, coughing, sinus pressure and a sore throat. Pertinent negatives include no chills, headaches or shortness of breath. Past treatments include oral decongestants. The treatment provided mild relief.    Review of Systems  Constitutional: Negative for chills, fatigue and fever.  HENT: Positive for congestion, sinus pressure and sore throat.   Respiratory: Positive for cough. Negative for chest tightness, shortness of breath and wheezing.   Neurological: Negative for dizziness and headaches.    Patient Active Problem List   Diagnosis Date Noted  . Foot pain, right 04/03/2018  . Vitamin D deficiency 04/03/2018  . Hx of hematuria 10/01/2017  . Obesity (BMI 30-39.9) 08/04/2017  . Parathyroid adenoma 09/19/2016  . History of multiple pulmonary nodules 03/17/2016  . Chronic fatigue, unspecified 02/16/2016  . Familial benign pemphigus 02/16/2016  . Polyarthralgia 02/16/2016  . GERD (gastroesophageal reflux disease) 02/03/2016  . Allergic rhinitis 02/03/2016  . Hypercholesterolemia 02/19/2014    Allergies  Allergen Reactions  . Atorvastatin Other (See Comments)    myalgia  . Lovastatin Other (See Comments)    Myalgia  . Tramadol     Lips numb    Past Surgical History:  Procedure Laterality Date  . ABDOMINAL HYSTERECTOMY     Still has ovaries. No cervix or uterus.  Marland Kitchen BREAST CYST ASPIRATION Right    neg  . COLONOSCOPY  11/02/2009   normal - repeat 10 yrs  . [facility]    . PARATHYROIDECTOMY Right 09/19/2016   Procedure: EXCISION  PARATHYROID ADENOMA;  Surgeon: Margaretha Sheffield, MD;  Location: [facility] ORS;  Service: ENT;  Laterality: Right;  . SINUS SURGERY WITH INSTATRAK      Social History   Tobacco Use  . Smoking status: Never Smoker  . Smokeless tobacco: Never Used  Substance Use Topics  . Alcohol use: Yes    Comment: occ  . Drug use: No     Medication list has been reviewed and updated.  Current Meds  Medication Sig  . Ascorbic Acid (VITAMIN C) 1000 MG tablet Take 1,000 mg by mouth daily.  . Calcium Carbonate-Vit D-Min (CALCIUM 1200 PO) Take by mouth.  . CVS D3 2000 units CAPS Take 1,000 Units by mouth 2 (two) times daily.   . dapsone 100 MG tablet Take 50 mg by mouth daily.   . fluocinonide ointment (LIDEX) 2.95 % Apply 1 application topically daily as needed (rash).   . Ivermectin (SOOLANTRA) 1 % CREA Apply 1 application topically at bedtime.  . metroNIDAZOLE (METROGEL) 1 % gel Apply 1 application topically daily.   Marland Kitchen pyridOXINE (VITAMIN B-6) 100 MG tablet Take 100 mg by mouth daily.  . ranitidine (ZANTAC) 150 MG tablet Take 1 tablet (150 mg total) by mouth 2 (two) times daily.  Marland Kitchen tretinoin (RETIN-A) 0.025 % cream Apply 1 application topically daily.    PHQ 2/9 Scores 04/03/2018 10/03/2017  PHQ - 2 Score 0 0    Physical Exam  Constitutional: She is oriented to person, place, and time. She appears well-developed and well-nourished.  HENT:  Right Ear: External ear and ear canal normal. Tympanic membrane is not erythematous and not retracted.  Left Ear: External ear and ear canal normal. Tympanic membrane is not erythematous and not retracted.  Nose: Right sinus exhibits maxillary sinus tenderness and frontal sinus tenderness. Left sinus exhibits maxillary sinus tenderness and frontal sinus tenderness.  Mouth/Throat: Uvula is midline and mucous membranes are normal. No oral lesions. Posterior oropharyngeal erythema present. No oropharyngeal exudate.  Cardiovascular: Normal rate, regular rhythm and normal  heart sounds.  Pulmonary/Chest: Breath sounds normal. She has no wheezes. She has no rales.  Lymphadenopathy:    She has no cervical adenopathy.  Neurological: She is alert and oriented to person, place, and time.    BP 116/78 (BP Location: Right Arm, Patient Position: Sitting, Cuff Size: Large)   Pulse 70   Temp 97.7 F (36.5 C) (Oral)   Ht 5\' 4"  (1.626 m)   Wt 231 lb (104.8 kg)   SpO2 94%   BMI 39.65 kg/m   Assessment and Plan: 1. Acute non-recurrent pansinusitis Continue Mucinex-d Add flonase spray - amoxicillin-clavulanate (AUGMENTIN) 875-125 MG tablet; Take 1 tablet by mouth 2 (two) times daily for 10 days.  Dispense: 20 tablet; Refill: 0   Partially dictated using Editor, commissioning. Any errors are unintentional.  Halina Maidens, MD [facility]  08/13/2018

## 2018-08-13 NOTE — Patient Instructions (Signed)
Continue Mucinex-D and add Flonase/Nasacort nasal spray

## 2018-11-05 ENCOUNTER — Encounter: Payer: Self-pay | Admitting: Internal Medicine

## 2018-11-05 ENCOUNTER — Ambulatory Visit: Payer: 59 | Admitting: Internal Medicine

## 2018-11-05 VITALS — BP 130/76 | HR 90 | Temp 97.9°F | Ht 64.0 in | Wt 231.8 lb

## 2018-11-05 DIAGNOSIS — J0100 Acute maxillary sinusitis, unspecified: Secondary | ICD-10-CM | POA: Diagnosis not present

## 2018-11-05 DIAGNOSIS — R059 Cough, unspecified: Secondary | ICD-10-CM

## 2018-11-05 DIAGNOSIS — R05 Cough: Secondary | ICD-10-CM

## 2018-11-05 MED ORDER — HYDROCOD POLST-CPM POLST ER 10-8 MG/5ML PO SUER: 5.0000 mL | Freq: Two times a day (BID) | ORAL | 0 refills | Status: DC

## 2018-11-05 MED ORDER — AMOXICILLIN-POT CLAVULANATE 875-125 MG PO TABS: 1.0000 | ORAL_TABLET | Freq: Two times a day (BID) | ORAL | 0 refills | Status: AC

## 2018-11-05 NOTE — Progress Notes (Signed)
Date:  11/05/2018   Name:  Diane Vasquez   DOB:  03-Jun-1956   MRN:  [id number]   Chief Complaint: Cough (Started 5 days ago. Cough- yellow/green production. Have not taken temp but feels like on and off fever. When coughing feels sob and having "fits". Tried Mucinex D and  Neti pot.)  Cough  This is a new problem. The current episode started in the past 7 days. The problem has been unchanged. The problem occurs every few minutes. The cough is productive of sputum. Associated symptoms include chills, headaches, nasal congestion, a sore throat, shortness of breath and wheezing. Pertinent negatives include no chest pain, ear pain, fever or rash. She has tried OTC cough suppressant for the symptoms. The treatment provided mild relief.    Review of Systems  Constitutional: Positive for chills and fatigue. Negative for fever.  HENT: Positive for sinus pressure and sore throat. Negative for ear pain and trouble swallowing.   Eyes: Negative for visual disturbance.  Respiratory: Positive for cough, chest tightness, shortness of breath and wheezing.   Cardiovascular: Negative for chest pain, palpitations and leg swelling.  Gastrointestinal: Negative for abdominal pain, diarrhea and vomiting.  Skin: Negative for rash.  Neurological: Positive for headaches. Negative for dizziness, weakness and light-headedness.  Hematological: Negative for adenopathy.  Psychiatric/Behavioral: Positive for sleep disturbance.    Patient Active Problem List   Diagnosis Date Noted  . Foot pain, right 04/03/2018  . Vitamin D deficiency 04/03/2018  . Hx of hematuria 10/01/2017  . Obesity (BMI 30-39.9) 08/04/2017  . Parathyroid adenoma 09/19/2016  . History of multiple pulmonary nodules 03/17/2016  . Chronic fatigue, unspecified 02/16/2016  . Familial benign pemphigus 02/16/2016  . Polyarthralgia 02/16/2016  . GERD (gastroesophageal reflux disease) 02/03/2016  . Allergic rhinitis 02/03/2016  .  Hypercholesterolemia 02/19/2014    Allergies  Allergen Reactions  . Atorvastatin Other (See Comments)    myalgia  . Lovastatin Other (See Comments)    Myalgia  . Tramadol     Lips numb    Past Surgical History:  Procedure Laterality Date  . ABDOMINAL HYSTERECTOMY     Still has ovaries. No cervix or uterus.  Marland Kitchen BREAST CYST ASPIRATION Right    neg  . COLONOSCOPY  11/02/2009   normal - repeat 10 yrs  . [facility]    . PARATHYROIDECTOMY Right 09/19/2016   Procedure: EXCISION PARATHYROID ADENOMA;  Surgeon: Margaretha Sheffield, MD;  Location: [facility] ORS;  Service: ENT;  Laterality: Right;  . SINUS SURGERY WITH INSTATRAK      Social History   Tobacco Use  . Smoking status: Never Smoker  . Smokeless tobacco: Never Used  Substance Use Topics  . Alcohol use: Yes    Comment: occ  . Drug use: No     Medication list has been reviewed and updated.  Current Meds  Medication Sig  . Ascorbic Acid (VITAMIN C) 1000 MG tablet Take 1,000 mg by mouth daily.  . Calcium Carbonate-Vit D-Min (CALCIUM 1200 PO) Take by mouth.  . CVS D3 2000 units CAPS Take 1,000 Units by mouth 2 (two) times daily.   . dapsone 100 MG tablet Take 50 mg by mouth daily.   . fluocinonide ointment (LIDEX) 0.93 % Apply 1 application topically daily as needed (rash).   . Ivermectin (SOOLANTRA) 1 % CREA Apply 1 application topically at bedtime.  . metroNIDAZOLE (METROGEL) 1 % gel Apply 1 application topically daily.   . ranitidine (ZANTAC) 150 MG tablet Take 1 tablet (  150 mg total) by mouth 2 (two) times daily.  Marland Kitchen tretinoin (RETIN-A) 0.025 % cream Apply 1 application topically daily.    PHQ 2/9 Scores 04/03/2018 10/03/2017  PHQ - 2 Score 0 0    Physical Exam Constitutional:      Appearance: She is well-developed.  HENT:     Right Ear: Ear canal and external ear normal. Tympanic membrane is not erythematous or retracted.     Left Ear: Ear canal and external ear normal. Tympanic membrane is not erythematous or retracted.      Nose:     Right Sinus: Maxillary sinus tenderness and frontal sinus tenderness present.     Left Sinus: Maxillary sinus tenderness and frontal sinus tenderness present.     Mouth/Throat:     Mouth: No oral lesions.     Pharynx: Uvula midline. No oropharyngeal exudate or posterior oropharyngeal erythema.  Neck:     Musculoskeletal: Normal range of motion and neck supple.  Cardiovascular:     Rate and Rhythm: Normal rate and regular rhythm.     Heart sounds: Normal heart sounds.  Pulmonary:     Effort: Pulmonary effort is normal.     Breath sounds: Normal breath sounds. No wheezing, rhonchi or rales.  Musculoskeletal: Normal range of motion.  Lymphadenopathy:     Cervical: No cervical adenopathy.  Neurological:     Mental Status: She is alert and oriented to person, place, and time.  Psychiatric:        Mood and Affect: Mood normal.     BP 130/76 (BP Location: Right Arm, Patient Position: Sitting, Cuff Size: Large)   Pulse 90   Temp 97.9 F (36.6 C) (Oral)   Ht 5\' 4"  (1.626 m)   Wt 231 lb 12.8 oz (105.1 kg)   SpO2 92%   BMI 39.79 kg/m   Assessment and Plan: 1. Acute non-recurrent maxillary sinusitis Continue Mucinex-D and Zyrtec - amoxicillin-clavulanate (AUGMENTIN) 875-125 MG tablet; Take 1 tablet by mouth 2 (two) times daily for 10 days.  Dispense: 20 tablet; Refill: 0  2. Cough - chlorpheniramine-HYDROcodone (TUSSIONEX PENNKINETIC ER) 10-8 MG/5ML SUER; Take 5 mLs by mouth 2 (two) times daily.  Dispense: 115 mL; Refill: 0   Partially dictated using Editor, commissioning. Any errors are unintentional.  Halina Maidens, MD [facility]  11/05/2018

## 2018-12-26 DIAGNOSIS — Z85820 Personal history of malignant melanoma of skin: Secondary | ICD-10-CM | POA: Diagnosis not present

## 2018-12-26 DIAGNOSIS — Z872 Personal history of diseases of the skin and subcutaneous tissue: Secondary | ICD-10-CM | POA: Diagnosis not present

## 2018-12-26 DIAGNOSIS — Z859 Personal history of malignant neoplasm, unspecified: Secondary | ICD-10-CM | POA: Diagnosis not present

## 2018-12-26 DIAGNOSIS — Q828 Other specified congenital malformations of skin: Secondary | ICD-10-CM | POA: Diagnosis not present

## 2018-12-31 DIAGNOSIS — H0100A Unspecified blepharitis right eye, upper and lower eyelids: Secondary | ICD-10-CM | POA: Diagnosis not present

## 2019-01-16 DIAGNOSIS — C44729 Squamous cell carcinoma of skin of left lower limb, including hip: Secondary | ICD-10-CM | POA: Diagnosis not present

## 2019-01-16 DIAGNOSIS — D485 Neoplasm of uncertain behavior of skin: Secondary | ICD-10-CM | POA: Diagnosis not present

## 2019-02-12 DIAGNOSIS — C44729 Squamous cell carcinoma of skin of left lower limb, including hip: Secondary | ICD-10-CM | POA: Diagnosis not present

## 2019-02-12 HISTORY — PX: OTHER SURGICAL HISTORY: SHX169

## 2019-02-26 DIAGNOSIS — L249 Irritant contact dermatitis, unspecified cause: Secondary | ICD-10-CM | POA: Diagnosis not present

## 2019-02-26 DIAGNOSIS — I872 Venous insufficiency (chronic) (peripheral): Secondary | ICD-10-CM | POA: Diagnosis not present

## 2019-03-05 DIAGNOSIS — I872 Venous insufficiency (chronic) (peripheral): Secondary | ICD-10-CM | POA: Diagnosis not present

## 2019-03-12 ENCOUNTER — Other Ambulatory Visit: Payer: Self-pay | Admitting: Internal Medicine

## 2019-03-12 DIAGNOSIS — I872 Venous insufficiency (chronic) (peripheral): Secondary | ICD-10-CM | POA: Diagnosis not present

## 2019-03-12 DIAGNOSIS — K219 Gastro-esophageal reflux disease without esophagitis: Secondary | ICD-10-CM

## 2019-03-19 DIAGNOSIS — E559 Vitamin D deficiency, unspecified: Secondary | ICD-10-CM | POA: Diagnosis not present

## 2019-03-19 DIAGNOSIS — E890 Postprocedural hypothyroidism: Secondary | ICD-10-CM | POA: Diagnosis not present

## 2019-03-19 DIAGNOSIS — I872 Venous insufficiency (chronic) (peripheral): Secondary | ICD-10-CM | POA: Diagnosis not present

## 2019-03-19 DIAGNOSIS — Z483 Aftercare following surgery for neoplasm: Secondary | ICD-10-CM | POA: Diagnosis not present

## 2019-04-09 ENCOUNTER — Other Ambulatory Visit: Payer: Self-pay

## 2019-04-09 ENCOUNTER — Ambulatory Visit: Payer: 59 | Admitting: Internal Medicine

## 2019-04-09 ENCOUNTER — Encounter: Payer: Self-pay | Admitting: Internal Medicine

## 2019-04-09 VITALS — BP 108/70 | HR 71 | Ht 64.0 in | Wt 231.0 lb

## 2019-04-09 DIAGNOSIS — K219 Gastro-esophageal reflux disease without esophagitis: Secondary | ICD-10-CM

## 2019-04-09 DIAGNOSIS — Z1231 Encounter for screening mammogram for malignant neoplasm of breast: Secondary | ICD-10-CM

## 2019-04-09 DIAGNOSIS — Z0000 Encounter for general adult medical examination without abnormal findings: Secondary | ICD-10-CM

## 2019-04-09 DIAGNOSIS — M2550 Pain in unspecified joint: Secondary | ICD-10-CM

## 2019-04-09 DIAGNOSIS — Q828 Other specified congenital malformations of skin: Secondary | ICD-10-CM

## 2019-04-09 DIAGNOSIS — E7800 Pure hypercholesterolemia, unspecified: Secondary | ICD-10-CM

## 2019-04-09 DIAGNOSIS — E559 Vitamin D deficiency, unspecified: Secondary | ICD-10-CM

## 2019-04-09 LAB — POCT URINALYSIS DIPSTICK
Bilirubin, UA: NEGATIVE
Blood, UA: NEGATIVE
Glucose, UA: NEGATIVE
Ketones, UA: NEGATIVE
Leukocytes, UA: NEGATIVE
Nitrite, UA: NEGATIVE
Protein, UA: NEGATIVE
Spec Grav, UA: 1.02 (ref 1.010–1.025)
Urobilinogen, UA: 0.2 E.U./dL
pH, UA: 6.5 (ref 5.0–8.0)

## 2019-04-09 MED ORDER — CIMETIDINE 400 MG PO TABS: 400.0000 mg | ORAL_TABLET | Freq: Two times a day (BID) | ORAL | 1 refills | Status: DC

## 2019-04-09 NOTE — Progress Notes (Signed)
Date:  04/09/2019   Name:  Diane Vasquez   DOB:  18-Jan-1956   MRN:  [id number]   Chief Complaint: Annual Exam (Breast Exam. No pap- hysterectomy.), Gastroesophageal Reflux (Zantac now recalled- needs alternative medication for GERD. ), and Vitamin D (Wants Vitamin D checked yearly per Dr. Kathyrn Sheriff. Seen him last week and he released her. ) Diane Vasquez is a 63 y.o. female who presents today for her Complete Annual Exam. She feels well. She reports exercising none regularly. She reports she is sleeping well.   Mammogram 11/2017 Colonoscopy 10/2009 DEXA 2018 - normal.  Gastroesophageal Reflux She complains of heartburn. She reports no abdominal pain, no chest pain, no coughing or no wheezing. This is a recurrent problem. The problem occurs occasionally. Pertinent negatives include no fatigue. She has tried a histamine-2 antagonist (previously on Zantac) for the symptoms. The treatment provided significant relief.  Familial benign pemphigus - followed by dermatology, Dr. Phillip Heal.  She also recently had a SCCA removed from her left leg.  She is on Dapsone for the FBP disease - she tries to taper the medication intermittently.  Currently the skin disease is under good control.  Polyarthralgia - she was evaluated for joint pains around the same time that she was started on high dose dapsone.  After the evaluation, she decided that the Dapsone was the cause and since then she keeps the dose as low as possible.  She intermittently has mild knee aches but there are no persistent symptoms.  Vitamin D def - low in the past, followed by Dr. Kathyrn Sheriff but now released since her parathyroid adenoma removal.  She is on daily supplement.  Review of Systems  Constitutional: Negative for chills, fatigue and fever.  HENT: Negative for congestion, hearing loss, tinnitus, trouble swallowing and voice change.   Eyes: Negative for visual disturbance.  Respiratory: Negative for cough, chest tightness, shortness  of breath and wheezing.   Cardiovascular: Negative for chest pain, palpitations and leg swelling.  Gastrointestinal: Positive for heartburn. Negative for abdominal pain, constipation, diarrhea and vomiting.  Endocrine: Negative for polydipsia and polyuria.  Genitourinary: Negative for dysuria, frequency, genital sores, vaginal bleeding and vaginal discharge.  Musculoskeletal: Negative for arthralgias, gait problem and joint swelling.  Skin: Negative for color change and rash.  Allergic/Immunologic: Negative for environmental allergies.  Neurological: Negative for dizziness, tremors, light-headedness and headaches.  Hematological: Negative for adenopathy. Does not bruise/bleed easily.  Psychiatric/Behavioral: Negative for dysphoric mood and sleep disturbance. The patient is not nervous/anxious.     Patient Active Problem List   Diagnosis Date Noted  . Foot pain, right 04/03/2018  . Vitamin D deficiency 04/03/2018  . Hx of hematuria 10/01/2017  . Obesity (BMI 30-39.9) 08/04/2017  . Parathyroid adenoma 09/19/2016  . History of multiple pulmonary nodules 03/17/2016  . Chronic fatigue, unspecified 02/16/2016  . Familial benign pemphigus 02/16/2016  . Polyarthralgia 02/16/2016  . GERD (gastroesophageal reflux disease) 02/03/2016  . Allergic rhinitis 02/03/2016  . Hypercholesterolemia 02/19/2014    Allergies  Allergen Reactions  . Atorvastatin Other (See Comments)    myalgia  . Lovastatin Other (See Comments)    Myalgia  . Tramadol     Lips numb    Past Surgical History:  Procedure Laterality Date  . ABDOMINAL HYSTERECTOMY     Still has ovaries. No cervix or uterus.  Marland Kitchen BREAST CYST ASPIRATION Right    neg  . COLONOSCOPY  11/02/2009   normal - repeat 10 yrs  . [facility]    .  PARATHYROIDECTOMY Right 09/19/2016   Procedure: EXCISION PARATHYROID ADENOMA;  Surgeon: Margaretha Sheffield, MD;  Location: [facility] ORS;  Service: ENT;  Laterality: Right;  . SINUS SURGERY WITH INSTATRAK    . sqamous  cell removal  02/12/2019   Dr. Phillip Heal.    Social History   Tobacco Use  . Smoking status: Never Smoker  . Smokeless tobacco: Never Used  Substance Use Topics  . Alcohol use: Yes    Comment: occ  . Drug use: No     Medication list has been reviewed and updated.  Current Meds  Medication Sig  . Ascorbic Acid (VITAMIN C) 1000 MG tablet Take 1,000 mg by mouth daily.  . Calcium Carbonate-Vit D-Min (CALCIUM 1200 PO) Take by mouth.  . [employer] D3 2000 units CAPS Take 1,000 Units by mouth 2 (two) times daily.   . dapsone 100 MG tablet Take 50 mg by mouth daily.   . fluocinonide ointment (LIDEX) 7.82 % Apply 1 application topically daily as needed (rash).   . Ivermectin (SOOLANTRA) 1 % CREA Apply 1 application topically at bedtime.  . metroNIDAZOLE (METROGEL) 1 % gel Apply 1 application topically daily.   Marland Kitchen pyridOXINE (VITAMIN B-6) 100 MG tablet Take 100 mg by mouth daily.  Marland Kitchen tretinoin (RETIN-A) 0.025 % cream Apply 1 application topically daily.    PHQ 2/9 Scores 04/09/2019 04/03/2018 10/03/2017  PHQ - 2 Score 0 0 0    BP Readings from Last 3 Encounters:  04/09/19 108/70  11/05/18 130/76  08/13/18 116/78    Physical Exam Vitals signs and nursing note reviewed.  Constitutional:      General: She is not in acute distress.    Appearance: She is well-developed.  HENT:     Head: Normocephalic and atraumatic.     Right Ear: Tympanic membrane and ear canal normal.     Left Ear: Tympanic membrane and ear canal normal.     Nose:     Right Sinus: No maxillary sinus tenderness.     Left Sinus: No maxillary sinus tenderness.     Mouth/Throat:     Pharynx: Uvula midline.  Eyes:     General: No scleral icterus.       Right eye: No discharge.        Left eye: No discharge.     Conjunctiva/sclera: Conjunctivae normal.  Neck:     Musculoskeletal: Normal range of motion. No erythema.     Thyroid: No thyromegaly.     Vascular: No carotid bruit.  Cardiovascular:     Rate and Rhythm:  Normal rate and regular rhythm.     Pulses: Normal pulses.     Heart sounds: Normal heart sounds.  Pulmonary:     Effort: Pulmonary effort is normal. No respiratory distress.     Breath sounds: No wheezing.  Chest:     Breasts:        Right: No mass, nipple discharge, skin change or tenderness.        Left: No mass, nipple discharge, skin change or tenderness.  Abdominal:     General: Bowel sounds are normal.     Palpations: Abdomen is soft.     Tenderness: There is no abdominal tenderness.  Musculoskeletal: Normal range of motion.     Right lower leg: No edema.     Left lower leg: No edema.  Lymphadenopathy:     Cervical: No cervical adenopathy.  Skin:    General: Skin is warm and dry.     Capillary  Refill: Capillary refill takes less than 2 seconds.     Findings: No rash.     Comments: Thinning noted under both breasts  Neurological:     Mental Status: She is alert and oriented to person, place, and time.     Cranial Nerves: No cranial nerve deficit.     Sensory: No sensory deficit.     Deep Tendon Reflexes: Reflexes are normal and symmetric.  Psychiatric:        Attention and Perception: Attention normal.        Mood and Affect: Mood normal.        Speech: Speech normal.        Behavior: Behavior normal.        Thought Content: Thought content normal.        Cognition and Memory: Cognition normal.     Wt Readings from Last 3 Encounters:  04/09/19 231 lb (104.8 kg)  11/05/18 231 lb 12.8 oz (105.1 kg)  08/13/18 231 lb (104.8 kg)    BP 108/70   Pulse 71   Ht '5\' 4"'$  (1.626 m)   Wt 231 lb (104.8 kg)   SpO2 95%   BMI 39.65 kg/m   Assessment and Plan: 1. Annual physical exam Normal exam except for weight Work on diet, resume exercise when able - Comprehensive metabolic panel - TSH - POCT urinalysis dipstick  2. Encounter for screening mammogram for breast cancer To be scheduled at [place]; Future  3. Gastroesophageal reflux  disease, esophagitis presence not specified Stop zantac - CBC with Differential/Platelet - cimetidine (TAGAMET) 400 MG tablet; Take 1 tablet (400 mg total) by mouth 2 (two) times daily.  Dispense: 180 tablet; Refill: 1  4. Familial benign pemphigus Continue Dapsone at the lowest dose that controls her symptoms  5. Polyarthralgia Essentially resolved  6. Hypercholesterolemia Check labs - Lipid panel  7. Vitamin D deficiency Continue daily supplementation   Partially dictated using Editor, commissioning. Any errors are unintentional.  Halina Maidens, MD [facility]  04/09/2019

## 2019-04-10 LAB — COMPREHENSIVE METABOLIC PANEL
ALT: 17 IU/L (ref 0–32)
AST: 19 IU/L (ref 0–40)
Albumin/Globulin Ratio: 2.3 — ABNORMAL HIGH (ref 1.2–2.2)
Albumin: 4.5 g/dL (ref 3.8–4.9)
Alkaline Phosphatase: 83 IU/L (ref 39–117)
BUN/Creatinine Ratio: 14 (ref 12–28)
BUN: 11 mg/dL (ref 8–27)
Bilirubin Total: 0.4 mg/dL (ref 0.0–1.2)
CO2: 23 mmol/L (ref 20–29)
Calcium: 9.3 mg/dL (ref 8.7–10.3)
Chloride: 105 mmol/L (ref 96–106)
Creatinine, Ser: 0.79 mg/dL (ref 0.57–1.00)
GFR calc Af Amer: 94 mL/min/{1.73_m2} (ref >59)
GFR calc non Af Amer: 82 mL/min/{1.73_m2} (ref >59)
Globulin, Total: 2 g/dL (ref 1.5–4.5)
Glucose: 92 mg/dL (ref 65–99)
Potassium: 4.4 mmol/L (ref 3.5–5.2)
Sodium: 140 mmol/L (ref 134–144)
Total Protein: 6.5 g/dL (ref 6.0–8.5)

## 2019-04-10 LAB — LIPID PANEL
Chol/HDL Ratio: 4.8 ratio — ABNORMAL HIGH (ref 0.0–4.4)
Cholesterol, Total: 219 mg/dL — ABNORMAL HIGH (ref 100–199)
HDL: 46 mg/dL (ref >39)
LDL Calculated: 145 mg/dL — ABNORMAL HIGH (ref 0–99)
Triglycerides: 140 mg/dL (ref 0–149)
VLDL Cholesterol Cal: 28 mg/dL (ref 5–40)

## 2019-04-10 LAB — CBC WITH DIFFERENTIAL/PLATELET
Basophils Absolute: 0.1 10*3/uL (ref 0.0–0.2)
Basos: 1 %
EOS (ABSOLUTE): 0.1 10*3/uL (ref 0.0–0.4)
Eos: 2 %
Hematocrit: 41.8 % (ref 34.0–46.6)
Hemoglobin: 13.2 g/dL (ref 11.1–15.9)
Immature Grans (Abs): 0 10*3/uL (ref 0.0–0.1)
Immature Granulocytes: 0 %
Lymphocytes Absolute: 1.7 10*3/uL (ref 0.7–3.1)
Lymphs: 37 %
MCH: 31.9 pg (ref 26.6–33.0)
MCHC: 31.6 g/dL (ref 31.5–35.7)
MCV: 101 fL — ABNORMAL HIGH (ref 79–97)
Monocytes Absolute: 0.4 10*3/uL (ref 0.1–0.9)
Monocytes: 8 %
Neutrophils Absolute: 2.4 10*3/uL (ref 1.4–7.0)
Neutrophils: 52 %
Platelets: 228 10*3/uL (ref 150–450)
RBC: 4.14 x10E6/uL (ref 3.77–5.28)
RDW: 12.8 % (ref 11.7–15.4)
WBC: 4.6 10*3/uL (ref 3.4–10.8)

## 2019-04-10 LAB — TSH: TSH: 2.49 u[IU]/mL (ref 0.450–4.500)

## 2019-04-23 ENCOUNTER — Other Ambulatory Visit: Payer: Self-pay

## 2019-04-23 ENCOUNTER — Ambulatory Visit
Admission: RE | Admit: 2019-04-23 | Discharge: 2019-04-23 | Disposition: A | Discharge status: Home / Self Care | Payer: 59 | Source: Ambulatory Visit | Attending: Internal Medicine | Admitting: Internal Medicine

## 2019-04-23 DIAGNOSIS — Z1231 Encounter for screening mammogram for malignant neoplasm of breast: Secondary | ICD-10-CM | POA: Diagnosis not present

## 2019-12-06 ENCOUNTER — Other Ambulatory Visit: Payer: Self-pay | Admitting: Internal Medicine

## 2019-12-06 DIAGNOSIS — K219 Gastro-esophageal reflux disease without esophagitis: Secondary | ICD-10-CM

## 2020-02-06 ENCOUNTER — Telehealth: Payer: Self-pay

## 2020-02-06 NOTE — Telephone Encounter (Signed)
Left patient VM informing to call back about colonoscopy.  CM

## 2020-02-06 NOTE — Telephone Encounter (Signed)
-----   Message from Glean Hess, MD sent at 02/04/2020  1:46 PM EDT ----- Regarding: colonoscopy Please let pt know that she is due for a colonoscopy.  Can we make a referral?  Does she know where her other one was done and who was the MD?

## 2020-02-07 ENCOUNTER — Telehealth: Payer: Self-pay

## 2020-02-07 NOTE — Telephone Encounter (Signed)
Pt was returning call concerning having a colonoscopy- she is scheduled for June with [facility] for a consultation. Then they will schedule her colonoscopy

## 2020-04-09 ENCOUNTER — Other Ambulatory Visit: Payer: Self-pay

## 2020-04-09 ENCOUNTER — Ambulatory Visit (INDEPENDENT_AMBULATORY_CARE_PROVIDER_SITE_OTHER): Payer: 59 | Admitting: Internal Medicine

## 2020-04-09 ENCOUNTER — Encounter: Payer: Self-pay | Admitting: Internal Medicine

## 2020-04-09 VITALS — BP 128/72 | HR 86 | Temp 98.0°F | Ht 64.0 in | Wt 230.0 lb

## 2020-04-09 DIAGNOSIS — Z1231 Encounter for screening mammogram for malignant neoplasm of breast: Secondary | ICD-10-CM | POA: Diagnosis not present

## 2020-04-09 DIAGNOSIS — Q828 Other specified congenital malformations of skin: Secondary | ICD-10-CM

## 2020-04-09 DIAGNOSIS — E7800 Pure hypercholesterolemia, unspecified: Secondary | ICD-10-CM

## 2020-04-09 DIAGNOSIS — Z0000 Encounter for general adult medical examination without abnormal findings: Secondary | ICD-10-CM

## 2020-04-09 DIAGNOSIS — K219 Gastro-esophageal reflux disease without esophagitis: Secondary | ICD-10-CM

## 2020-04-09 DIAGNOSIS — Z1211 Encounter for screening for malignant neoplasm of colon: Secondary | ICD-10-CM | POA: Diagnosis not present

## 2020-04-09 LAB — POCT URINALYSIS DIPSTICK
Bilirubin, UA: NEGATIVE
Glucose, UA: NEGATIVE
Ketones, UA: NEGATIVE
Leukocytes, UA: NEGATIVE
Nitrite, UA: NEGATIVE
Protein, UA: NEGATIVE
Spec Grav, UA: 1.015 (ref 1.010–1.025)
Urobilinogen, UA: 0.2 E.U./dL
pH, UA: 6 (ref 5.0–8.0)

## 2020-04-09 NOTE — Progress Notes (Signed)
Date:  04/09/2020   Name:  Diane Vasquez   DOB:  16-May-1956   MRN:  [id number]   Chief Complaint: Annual Exam (Breast Exam- no pap- hyst.) Diane Vasquez is a 64 y.o. female who presents today for her Complete Annual Exam. She feels well. She reports exercising - video tapes at home, walking on lunch. She reports she is sleeping well.   Mammogram 03/2019 Pap discontinued Colonoscopy  2011 - referral done; scheduled Immunization History  Administered Date(s) Administered  . Influenza,inj,Quad PF,6+ Mos 08/09/2016, 10/03/2017, 09/18/2018  . Influenza-Unspecified 09/18/2018, 09/10/2019  . Tdap 10/22/2013    Gastroesophageal Reflux She complains of heartburn. She reports no abdominal pain, no chest pain or no coughing. This is a recurrent problem. The problem occurs rarely. Pertinent negatives include no fatigue. She has tried a histamine-2 antagonist and a PPI (alternated tagamet and omeprazole) for the symptoms. The treatment provided significant relief.  Pemphigus - treated by Dermatology.  O Dapsone but down to a half tablet every other day.  Also uses vaseline in problem areas rather than cortisone topicals.    Lab Results  Component Value Date   CREATININE 0.79 04/09/2019   BUN 11 04/09/2019   NA 140 04/09/2019   K 4.4 04/09/2019   CL 105 04/09/2019   CO2 23 04/09/2019   Lab Results  Component Value Date   CHOL 219 (H) 04/09/2019   HDL 46 04/09/2019   LDLCALC 145 (H) 04/09/2019   TRIG 140 04/09/2019   CHOLHDL 4.8 (H) 04/09/2019   Lab Results  Component Value Date   TSH 2.490 04/09/2019   No results found for: HGBA1C Lab Results  Component Value Date   WBC 4.6 04/09/2019   HGB 13.2 04/09/2019   HCT 41.8 04/09/2019   MCV 101 (H) 04/09/2019   PLT 228 04/09/2019   Lab Results  Component Value Date   ALT 17 04/09/2019   AST 19 04/09/2019   ALKPHOS 83 04/09/2019   BILITOT 0.4 04/09/2019     Review of Systems  Constitutional: Negative for fatigue and  unexpected weight change.  HENT: Negative for trouble swallowing.   Eyes: Negative for visual disturbance.  Respiratory: Negative for cough and chest tightness.   Cardiovascular: Negative for chest pain, palpitations and leg swelling.  Gastrointestinal: Positive for heartburn. Negative for abdominal pain, diarrhea and vomiting.  Endocrine: Negative for polyuria.  Genitourinary: Negative for vaginal bleeding and vaginal discharge.  Musculoskeletal: Negative for arthralgias and joint swelling.  Skin: Negative for rash.  Neurological: Negative for syncope, light-headedness and headaches.  Psychiatric/Behavioral: Negative for sleep disturbance. The patient is not nervous/anxious.     Patient Active Problem List   Diagnosis Date Noted  . Foot pain, right 04/03/2018  . Vitamin D deficiency 04/03/2018  . Hx of hematuria 10/01/2017  . Obesity (BMI 30-39.9) 08/04/2017  . Parathyroid adenoma 09/19/2016  . History of multiple pulmonary nodules 03/17/2016  . Chronic fatigue, unspecified 02/16/2016  . Familial benign pemphigus 02/16/2016  . Polyarthralgia 02/16/2016  . GERD (gastroesophageal reflux disease) 02/03/2016  . Allergic rhinitis 02/03/2016  . Hypercholesterolemia 02/19/2014    Allergies  Allergen Reactions  . Atorvastatin Other (See Comments)    myalgia  . Lovastatin Other (See Comments)    Myalgia  . Tramadol     Lips numb    Past Surgical History:  Procedure Laterality Date  . ABDOMINAL HYSTERECTOMY     Still has ovaries. No cervix or uterus.  Marland Kitchen BREAST CYST ASPIRATION Right  neg  . COLONOSCOPY  11/02/2009   normal - repeat 10 yrs  . [facility]    . PARATHYROIDECTOMY Right 09/19/2016   Procedure: EXCISION PARATHYROID ADENOMA;  Surgeon: Margaretha Sheffield, MD;  Location: [facility] ORS;  Service: ENT;  Laterality: Right;  . SINUS SURGERY WITH INSTATRAK    . sqamous cell removal  02/12/2019   Dr. Phillip Heal.    Social History   Tobacco Use  . Smoking status: Never Smoker  .  Smokeless tobacco: Never Used  Vaping Use  . Vaping Use: Never used  Substance Use Topics  . Alcohol use: Yes    Comment: occ  . Drug use: No     Medication list has been reviewed and updated.  Current Meds  Medication Sig  . Ascorbic Acid (VITAMIN C) 1000 MG tablet Take 1,000 mg by mouth daily.  . Calcium Carbonate-Vit D-Min (CALCIUM 1200 PO) Take by mouth.  . Cholecalciferol 25 MCG (1000 UT) capsule Take by mouth.  . cimetidine (TAGAMET) 400 MG tablet TAKE 1 TABLET BY MOUTH TWICE A DAY  . dapsone 100 MG tablet Take 50 mg by mouth daily.   . fluocinonide ointment (LIDEX) 4.78 % Apply 1 application topically daily as needed (rash).   . Ivermectin (SOOLANTRA) 1 % CREA Apply 1 application topically at bedtime.  . metroNIDAZOLE (METROGEL) 1 % gel Apply 1 application topically daily.   Marland Kitchen omeprazole (PRILOSEC) 10 MG capsule Take 10 mg by mouth daily. Alternate with tagamet.  . tretinoin (RETIN-A) 0.025 % cream Apply 1 application topically daily.    PHQ 2/9 Scores 04/09/2020 04/09/2019 04/03/2018 10/03/2017  PHQ - 2 Score 0 0 0 0  PHQ- 9 Score 0 - - -    GAD 7 : Generalized Anxiety Score 04/09/2020  Nervous, Anxious, on Edge 0  Control/stop worrying 0  Worry too much - different things 0  Trouble relaxing 0  Restless 0  Easily annoyed or irritable 0  Afraid - awful might happen 0  Total GAD 7 Score 0  Anxiety Difficulty Not difficult at all    BP Readings from Last 3 Encounters:  04/09/20 128/72  04/09/19 108/70  11/05/18 130/76    Physical Exam Vitals and nursing note reviewed.  Constitutional:      General: She is not in acute distress.    Appearance: She is well-developed.  HENT:     Head: Normocephalic and atraumatic.     Right Ear: Tympanic membrane and ear canal normal.     Left Ear: Tympanic membrane and ear canal normal.     Nose:     Right Sinus: No maxillary sinus tenderness.     Left Sinus: No maxillary sinus tenderness.  Eyes:     General: No scleral  icterus.       Right eye: No discharge.        Left eye: No discharge.     Conjunctiva/sclera: Conjunctivae normal.  Neck:     Thyroid: No thyromegaly.     Vascular: No carotid bruit.  Cardiovascular:     Rate and Rhythm: Normal rate and regular rhythm.     Pulses: Normal pulses.     Heart sounds: Normal heart sounds.  Pulmonary:     Effort: Pulmonary effort is normal. No respiratory distress.     Breath sounds: No wheezing.  Chest:     Breasts:        Right: No mass, nipple discharge, skin change or tenderness.        Left:  No mass, nipple discharge, skin change or tenderness.  Abdominal:     General: Bowel sounds are normal.     Palpations: Abdomen is soft.     Tenderness: There is no abdominal tenderness.  Musculoskeletal:        General: Normal range of motion.     Cervical back: Normal range of motion. No erythema.     Right lower leg: No edema.     Left lower leg: No edema.  Lymphadenopathy:     Cervical: No cervical adenopathy.  Skin:    General: Skin is warm and dry.     Capillary Refill: Capillary refill takes less than 2 seconds.     Findings: No rash.  Neurological:     General: No focal deficit present.     Mental Status: She is alert and oriented to person, place, and time.     Cranial Nerves: No cranial nerve deficit.     Sensory: No sensory deficit.     Deep Tendon Reflexes: Reflexes are normal and symmetric.  Psychiatric:        Mood and Affect: Mood normal.        Speech: Speech normal.     Wt Readings from Last 3 Encounters:  04/09/20 230 lb (104.3 kg)  04/09/19 231 lb (104.8 kg)  11/05/18 231 lb 12.8 oz (105.1 kg)    BP 128/72 (BP Location: Right Arm, Patient Position: Sitting, Cuff Size: Large)   Pulse 86   Temp 98 F (36.7 C) (Oral)   Ht 5\' 4"  (1.626 m)   Wt 230 lb (104.3 kg)   SpO2 96%   BMI 39.48 kg/m   Assessment and Plan: 1. Annual physical exam Normal exam except for weight Continue to work on diet changes and exercise -  Comprehensive metabolic panel - TSH - POCT urinalysis dipstick  2. Encounter for screening mammogram for breast cancer Schedule at [place]; Future  3. Colon cancer screening Scheduled for colonoscopy  4. Gastroesophageal reflux disease, unspecified whether esophagitis present Symptoms well controlled. No red flag signs such as weight loss, n/v, melena Will continue alternating tagamet and omeprazole. - CBC with Differential/Platelet  5. Familial benign pemphigus Sx are well controlled on low dose Dapsone Labs monitoring done by Dermatology  6. Hypercholesterolemia Check labs and advise - Lipid panel   Partially dictated using Dragon software. Any errors are unintentional.  Halina Maidens, MD [facility]  04/09/2020

## 2020-04-10 LAB — LIPID PANEL
Chol/HDL Ratio: 5.2 ratio — ABNORMAL HIGH (ref 0.0–4.4)
Cholesterol, Total: 214 mg/dL — ABNORMAL HIGH (ref 100–199)
HDL: 41 mg/dL (ref >39)
LDL Chol Calc (NIH): 147 mg/dL — ABNORMAL HIGH (ref 0–99)
Triglycerides: 144 mg/dL (ref 0–149)
VLDL Cholesterol Cal: 26 mg/dL (ref 5–40)

## 2020-04-10 LAB — COMPREHENSIVE METABOLIC PANEL
ALT: 20 IU/L (ref 0–32)
AST: 17 IU/L (ref 0–40)
Albumin/Globulin Ratio: 2.2 (ref 1.2–2.2)
Albumin: 4.6 g/dL (ref 3.8–4.8)
Alkaline Phosphatase: 82 IU/L (ref 48–121)
BUN/Creatinine Ratio: 14 (ref 12–28)
BUN: 12 mg/dL (ref 8–27)
Bilirubin Total: 0.5 mg/dL (ref 0.0–1.2)
CO2: 25 mmol/L (ref 20–29)
Calcium: 9.1 mg/dL (ref 8.7–10.3)
Chloride: 105 mmol/L (ref 96–106)
Creatinine, Ser: 0.83 mg/dL (ref 0.57–1.00)
GFR calc Af Amer: 88 mL/min/{1.73_m2} (ref >59)
GFR calc non Af Amer: 76 mL/min/{1.73_m2} (ref >59)
Globulin, Total: 2.1 g/dL (ref 1.5–4.5)
Glucose: 89 mg/dL (ref 65–99)
Potassium: 4.7 mmol/L (ref 3.5–5.2)
Sodium: 141 mmol/L (ref 134–144)
Total Protein: 6.7 g/dL (ref 6.0–8.5)

## 2020-04-10 LAB — CBC WITH DIFFERENTIAL/PLATELET
Basophils Absolute: 0.1 10*3/uL (ref 0.0–0.2)
Basos: 1 %
EOS (ABSOLUTE): 0.1 10*3/uL (ref 0.0–0.4)
Eos: 2 %
Hematocrit: 40.6 % (ref 34.0–46.6)
Hemoglobin: 13.7 g/dL (ref 11.1–15.9)
Immature Grans (Abs): 0 10*3/uL (ref 0.0–0.1)
Immature Granulocytes: 1 %
Lymphocytes Absolute: 1.5 10*3/uL (ref 0.7–3.1)
Lymphs: 33 %
MCH: 31.4 pg (ref 26.6–33.0)
MCHC: 33.7 g/dL (ref 31.5–35.7)
MCV: 93 fL (ref 79–97)
Monocytes Absolute: 0.5 10*3/uL (ref 0.1–0.9)
Monocytes: 10 %
Neutrophils Absolute: 2.4 10*3/uL (ref 1.4–7.0)
Neutrophils: 53 %
Platelets: 209 10*3/uL (ref 150–450)
RBC: 4.36 x10E6/uL (ref 3.77–5.28)
RDW: 12.4 % (ref 11.7–15.4)
WBC: 4.6 10*3/uL (ref 3.4–10.8)

## 2020-04-10 LAB — TSH: TSH: 3.03 u[IU]/mL (ref 0.450–4.500)

## 2020-04-23 ENCOUNTER — Ambulatory Visit
Admission: RE | Admit: 2020-04-23 | Discharge: 2020-04-23 | Disposition: A | Discharge status: Home / Self Care | Payer: 59 | Source: Ambulatory Visit | Attending: Internal Medicine | Admitting: Internal Medicine

## 2020-04-23 ENCOUNTER — Other Ambulatory Visit: Payer: Self-pay

## 2020-04-23 DIAGNOSIS — Z1231 Encounter for screening mammogram for malignant neoplasm of breast: Secondary | ICD-10-CM

## 2020-06-09 DIAGNOSIS — C4492 Squamous cell carcinoma of skin, unspecified: Secondary | ICD-10-CM | POA: Insufficient documentation

## 2020-08-27 ENCOUNTER — Other Ambulatory Visit: Payer: Self-pay | Admitting: Internal Medicine

## 2020-08-27 NOTE — Telephone Encounter (Signed)
Medication Refill - Medication:   omeprazole (PRILOSEC) 10 MG capsule [[id number]]    Preferred Pharmacy (with phone number or street name):  [id number] - [place], [address]  [place] [address]  Phone: [phone] Fax: [phone]  Hours: Not open 24 hours     Agent: Please be advised that RX refills may take up to 3 business days. We ask that you follow-up with your pharmacy.

## 2020-08-27 NOTE — Telephone Encounter (Signed)
Requested medication (s) are due for refill today: na   Requested medication (s) are on the active medication list: yes   Last refill:  dispense : not specified , historical med   Future visit scheduled: yes  Notes to clinic:  historical med     Requested Prescriptions  Pending Prescriptions Disp Refills   omeprazole (PRILOSEC) 10 MG capsule      Sig: Take 1 capsule (10 mg total) by mouth daily. Alternate with tagamet.      Gastroenterology: Proton Pump Inhibitors Passed - 08/27/2020  1:17 PM      Passed - Valid encounter within last 12 months    Recent Outpatient Visits           4 months ago Annual physical exam   [facility] Glean Hess, MD   1 year ago Annual physical exam   [facility] Glean Hess, MD   1 year ago Acute non-recurrent maxillary sinusitis   [facility] Glean Hess, MD   2 years ago Acute non-recurrent pansinusitis   [facility] Glean Hess, MD   2 years ago Foot pain, right   [facility] Glean Hess, MD       Future Appointments             In 7 months Army Melia Jesse Sans, MD [facility], [facility]

## 2020-08-31 MED ORDER — OMEPRAZOLE 10 MG PO CPDR: 10.0000 mg | DELAYED_RELEASE_CAPSULE | Freq: Every day | ORAL | 1 refills | Status: DC

## 2020-12-02 DIAGNOSIS — Z90710 Acquired absence of both cervix and uterus: Secondary | ICD-10-CM | POA: Insufficient documentation

## 2021-03-02 ENCOUNTER — Other Ambulatory Visit: Payer: Self-pay | Admitting: Internal Medicine

## 2021-03-30 ENCOUNTER — Other Ambulatory Visit: Payer: Self-pay | Admitting: Internal Medicine

## 2021-03-30 DIAGNOSIS — Z1231 Encounter for screening mammogram for malignant neoplasm of breast: Secondary | ICD-10-CM

## 2021-04-12 ENCOUNTER — Other Ambulatory Visit: Payer: Self-pay | Admitting: Internal Medicine

## 2021-04-13 ENCOUNTER — Encounter: Payer: Self-pay | Admitting: Internal Medicine

## 2021-04-13 ENCOUNTER — Ambulatory Visit (INDEPENDENT_AMBULATORY_CARE_PROVIDER_SITE_OTHER): Payer: 59 | Admitting: Internal Medicine

## 2021-04-13 ENCOUNTER — Other Ambulatory Visit: Payer: Self-pay | Admitting: Internal Medicine

## 2021-04-13 ENCOUNTER — Other Ambulatory Visit: Payer: Self-pay

## 2021-04-13 VITALS — BP 106/74 | HR 74 | Temp 97.7°F | Ht 64.0 in | Wt 231.0 lb

## 2021-04-13 DIAGNOSIS — K219 Gastro-esophageal reflux disease without esophagitis: Secondary | ICD-10-CM | POA: Diagnosis not present

## 2021-04-13 DIAGNOSIS — E7800 Pure hypercholesterolemia, unspecified: Secondary | ICD-10-CM

## 2021-04-13 DIAGNOSIS — Z0000 Encounter for general adult medical examination without abnormal findings: Secondary | ICD-10-CM

## 2021-04-13 DIAGNOSIS — D351 Benign neoplasm of parathyroid gland: Secondary | ICD-10-CM

## 2021-04-13 DIAGNOSIS — Z1231 Encounter for screening mammogram for malignant neoplasm of breast: Secondary | ICD-10-CM

## 2021-04-13 DIAGNOSIS — R430 Anosmia: Secondary | ICD-10-CM

## 2021-04-13 NOTE — Progress Notes (Signed)
Date:  04/13/2021   Name:  Diane Vasquez   DOB:  1956-09-29   MRN:  [id number]   Chief Complaint: Annual Exam (Breast Exam , No pap.) Diane Vasquez is a 65 y.o. female who presents today for her Complete Annual Exam. She feels well. She reports exercising - trying but "hit or miss". She reports she is sleeping well. Breast complaints - none.  Mammogram: 04/2020 scheduled 04/29/21 DEXA: not due Pap smear: discontinued Colonoscopy: 05/2020  Immunization History  Administered Date(s) Administered   Influenza,inj,Quad PF,6+ Mos 08/09/2016, 10/03/2017, 09/18/2018   Influenza-Unspecified 09/18/2018, 09/10/2019   Tdap 10/22/2013    Gastroesophageal Reflux She complains of heartburn. She reports no abdominal pain, no chest pain, no coughing or no wheezing. This is a chronic problem. Pertinent negatives include no fatigue. She has tried a PPI and a histamine-2 antagonist for the symptoms. The treatment provided significant relief.  Rash - dx with pemphigus; on Dapsone now down to 50 mg every other day with control of sx.  She sees Dr. Phillip Heal regularly.  Lab Results  Component Value Date   CREATININE 0.83 04/09/2020   BUN 12 04/09/2020   NA 141 04/09/2020   K 4.7 04/09/2020   CL 105 04/09/2020   CO2 25 04/09/2020   Lab Results  Component Value Date   CHOL 214 (H) 04/09/2020   HDL 41 04/09/2020   LDLCALC 147 (H) 04/09/2020   TRIG 144 04/09/2020   CHOLHDL 5.2 (H) 04/09/2020   Lab Results  Component Value Date   TSH 3.030 04/09/2020   No results found for: HGBA1C Lab Results  Component Value Date   WBC 4.6 04/09/2020   HGB 13.7 04/09/2020   HCT 40.6 04/09/2020   MCV 93 04/09/2020   PLT 209 04/09/2020   Lab Results  Component Value Date   ALT 20 04/09/2020   AST 17 04/09/2020   ALKPHOS 82 04/09/2020   BILITOT 0.5 04/09/2020     Review of Systems  Constitutional:  Negative for chills, fatigue and fever.  HENT:  Negative for congestion, hearing loss, tinnitus,  trouble swallowing and voice change.        Loss of smell since 10/2019  Eyes:  Negative for visual disturbance.  Respiratory:  Negative for cough, chest tightness, shortness of breath and wheezing.   Cardiovascular:  Negative for chest pain, palpitations and leg swelling.  Gastrointestinal:  Positive for heartburn. Negative for abdominal pain, constipation, diarrhea and vomiting.  Endocrine: Negative for polydipsia and polyuria.  Genitourinary:  Negative for dysuria, frequency, genital sores, vaginal bleeding and vaginal discharge.  Musculoskeletal:  Negative for arthralgias, gait problem and joint swelling.  Skin:  Negative for color change and rash.  Neurological:  Negative for dizziness, tremors, light-headedness and headaches.       Feels like she is vibrating, esp at night in her legs  Hematological:  Negative for adenopathy. Does not bruise/bleed easily.  Psychiatric/Behavioral:  Negative for dysphoric mood and sleep disturbance. The patient is not nervous/anxious.    Patient Active Problem List   Diagnosis Date Noted   Status post hysterectomy 12/02/2020   Personal history of other malignant neoplasm of skin 06/09/2020   Foot pain, right 04/03/2018   Vitamin D deficiency 04/03/2018   Hx of hematuria 10/01/2017   Obesity (BMI 30-39.9) 08/04/2017   Parathyroid adenoma 09/19/2016   History of multiple pulmonary nodules 03/17/2016   Chronic fatigue, unspecified 02/16/2016   Familial benign pemphigus 02/16/2016   Polyarthralgia 02/16/2016  GERD (gastroesophageal reflux disease) 02/03/2016   Allergic rhinitis 02/03/2016   Hypercholesterolemia 02/19/2014    Allergies  Allergen Reactions   Atorvastatin Other (See Comments)    myalgia   Lovastatin Other (See Comments)    Myalgia   Tramadol     Lips numb    Past Surgical History:  Procedure Laterality Date   ABDOMINAL HYSTERECTOMY     Still has ovaries. No cervix or uterus.   BREAST CYST ASPIRATION Right    neg    COLONOSCOPY  11/02/2009   normal - repeat 10 yrs   CTR     PARATHYROIDECTOMY Right 09/19/2016   Procedure: EXCISION PARATHYROID ADENOMA;  Surgeon: Margaretha Sheffield, MD;  Location: [facility] ORS;  Service: ENT;  Laterality: Right;   SINUS SURGERY WITH INSTATRAK     sqamous cell removal  02/12/2019   Dr. Phillip Heal.    Social History   Tobacco Use   Smoking status: Never   Smokeless tobacco: Never  Vaping Use   Vaping Use: Never used  Substance Use Topics   Alcohol use: Yes    Comment: occ   Drug use: No     Medication list has been reviewed and updated.  Current Meds  Medication Sig   Ascorbic Acid (VITAMIN C) 1000 MG tablet Take 1,000 mg by mouth daily.   cetirizine (ZYRTEC) 10 MG tablet Take 10 mg by mouth daily.   dapsone 100 MG tablet Take 50 mg by mouth daily.    metroNIDAZOLE (METROGEL) 1 % gel Apply 1 application topically daily.    omeprazole (PRILOSEC) 10 MG capsule TAKE 1 CAPSULE (10 MG TOTAL) BY MOUTH DAILY. ALTERNATE WITH TAGAMET.   tretinoin (RETIN-A) 0.05 % cream Apply topically at bedtime.   Vitamin D, Ergocalciferol, (DRISDOL) 1.25 MG (50000 UNIT) CAPS capsule Take 1 capsule by mouth once a week.   [DISCONTINUED] Calcium Carbonate-Vit D-Min (CALCIUM 1200 PO) Take by mouth.    PHQ 2/9 Scores 04/13/2021 04/09/2020 04/09/2019 04/03/2018  PHQ - 2 Score 0 0 0 0  PHQ- 9 Score 0 0 - -    GAD 7 : Generalized Anxiety Score 04/13/2021 04/09/2020  Nervous, Anxious, on Edge 0 0  Control/stop worrying 0 0  Worry too much - different things 0 0  Trouble relaxing 0 0  Restless 0 0  Easily annoyed or irritable 0 0  Afraid - awful might happen 0 0  Total GAD 7 Score 0 0  Anxiety Difficulty - Not difficult at all    BP Readings from Last 3 Encounters:  04/13/21 106/74  04/09/20 128/72  04/09/19 108/70    Physical Exam Vitals and nursing note reviewed.  Constitutional:      General: She is not in acute distress.    Appearance: She is well-developed.  HENT:     Head:  Normocephalic and atraumatic.     Right Ear: Tympanic membrane and ear canal normal.     Left Ear: Tympanic membrane and ear canal normal.     Nose:     Right Sinus: No maxillary sinus tenderness.     Left Sinus: No maxillary sinus tenderness.  Eyes:     General: No scleral icterus.       Right eye: No discharge.        Left eye: No discharge.     Conjunctiva/sclera: Conjunctivae normal.  Neck:     Thyroid: No thyromegaly.     Vascular: No carotid bruit.  Cardiovascular:     Rate and Rhythm: Normal  rate and regular rhythm.     Pulses: Normal pulses.     Heart sounds: Normal heart sounds.  Pulmonary:     Effort: Pulmonary effort is normal. No respiratory distress.     Breath sounds: No wheezing.  Chest:  Breasts:    Right: No mass, nipple discharge, skin change or tenderness.     Left: No mass, nipple discharge, skin change or tenderness.  Abdominal:     General: Bowel sounds are normal.     Palpations: Abdomen is soft.     Tenderness: There is no abdominal tenderness.  Musculoskeletal:     Cervical back: Normal range of motion. No erythema.     Right lower leg: No edema.     Left lower leg: No edema.  Lymphadenopathy:     Cervical: No cervical adenopathy.  Skin:    General: Skin is warm and dry.     Capillary Refill: Capillary refill takes less than 2 seconds.     Findings: No rash.  Neurological:     General: No focal deficit present.     Mental Status: She is alert and oriented to person, place, and time.     Cranial Nerves: No cranial nerve deficit.     Sensory: No sensory deficit.     Deep Tendon Reflexes: Reflexes are normal and symmetric.  Psychiatric:        Attention and Perception: Attention normal.        Mood and Affect: Mood normal.    Wt Readings from Last 3 Encounters:  04/13/21 231 lb (104.8 kg)  04/09/20 230 lb (104.3 kg)  04/09/19 231 lb (104.8 kg)    BP 106/74   Pulse 74   Temp 97.7 F (36.5 C) (Oral)   Ht 5\' 4"  (1.626 m)   Wt 231 lb  (104.8 kg)   SpO2 94%   BMI 39.65 kg/m   Assessment and Plan: 1. Annual physical exam Exam is normal except for weight. Encourage regular exercise and appropriate dietary changes. She is considering Shingrix vaccine  2. Encounter for screening mammogram for breast cancer Scheduled for July  3. Gastroesophageal reflux disease, unspecified whether esophagitis present Stopped PPI briefly and had some recurrence of gerd sx. No red flag signs. Resume omeprazole daily  4. Hypercholesterolemia Check labs and advise Continue low fat diet, exercise  5. Loss of sense of smell Recommend follow up with Dr. Kathyrn Sheriff.  6. Parathyroid adenoma S/p resection Having sx consistent with low serum calcium Will checks levels Resume daily calcium supplement  Lab Orders  CBC with Differential/Platelet  Comprehensive metabolic panel  Lipid panel  TSH    Partially dictated using Editor, commissioning. Any errors are unintentional.  Halina Maidens, MD [facility]  04/13/2021

## 2021-04-14 LAB — COMPREHENSIVE METABOLIC PANEL
ALT: 15 IU/L (ref 0–32)
AST: 16 IU/L (ref 0–40)
Albumin/Globulin Ratio: 2.1 (ref 1.2–2.2)
Albumin: 4.4 g/dL (ref 3.8–4.8)
Alkaline Phosphatase: 85 IU/L (ref 44–121)
BUN/Creatinine Ratio: 15 (ref 12–28)
BUN: 14 mg/dL (ref 8–27)
Bilirubin Total: 0.4 mg/dL (ref 0.0–1.2)
CO2: 23 mmol/L (ref 20–29)
Calcium: 9.2 mg/dL (ref 8.7–10.3)
Chloride: 104 mmol/L (ref 96–106)
Creatinine, Ser: 0.94 mg/dL (ref 0.57–1.00)
Globulin, Total: 2.1 g/dL (ref 1.5–4.5)
Glucose: 91 mg/dL (ref 65–99)
Potassium: 4.5 mmol/L (ref 3.5–5.2)
Sodium: 139 mmol/L (ref 134–144)
Total Protein: 6.5 g/dL (ref 6.0–8.5)
eGFR: 69 mL/min/{1.73_m2} (ref >59)

## 2021-04-14 LAB — CBC WITH DIFFERENTIAL/PLATELET
Basophils Absolute: 0.1 10*3/uL (ref 0.0–0.2)
Basos: 1 %
EOS (ABSOLUTE): 0.1 10*3/uL (ref 0.0–0.4)
Eos: 2 %
Hematocrit: 43.1 % (ref 34.0–46.6)
Hemoglobin: 14.3 g/dL (ref 11.1–15.9)
Immature Grans (Abs): 0 10*3/uL (ref 0.0–0.1)
Immature Granulocytes: 0 %
Lymphocytes Absolute: 1.6 10*3/uL (ref 0.7–3.1)
Lymphs: 36 %
MCH: 31.6 pg (ref 26.6–33.0)
MCHC: 33.2 g/dL (ref 31.5–35.7)
MCV: 95 fL (ref 79–97)
Monocytes Absolute: 0.4 10*3/uL (ref 0.1–0.9)
Monocytes: 8 %
Neutrophils Absolute: 2.4 10*3/uL (ref 1.4–7.0)
Neutrophils: 53 %
Platelets: 196 10*3/uL (ref 150–450)
RBC: 4.52 x10E6/uL (ref 3.77–5.28)
RDW: 12.4 % (ref 11.7–15.4)
WBC: 4.6 10*3/uL (ref 3.4–10.8)

## 2021-04-14 LAB — LIPID PANEL
Chol/HDL Ratio: 5 ratio — ABNORMAL HIGH (ref 0.0–4.4)
Cholesterol, Total: 209 mg/dL — ABNORMAL HIGH (ref 100–199)
HDL: 42 mg/dL (ref >39)
LDL Chol Calc (NIH): 144 mg/dL — ABNORMAL HIGH (ref 0–99)
Triglycerides: 129 mg/dL (ref 0–149)
VLDL Cholesterol Cal: 23 mg/dL (ref 5–40)

## 2021-04-14 LAB — TSH: TSH: 2.28 u[IU]/mL (ref 0.450–4.500)

## 2021-04-29 ENCOUNTER — Ambulatory Visit
Admission: RE | Admit: 2021-04-29 | Discharge: 2021-04-29 | Disposition: A | Discharge status: Home / Self Care | Payer: 59 | Source: Ambulatory Visit | Attending: Internal Medicine | Admitting: Internal Medicine

## 2021-04-29 ENCOUNTER — Other Ambulatory Visit: Payer: Self-pay

## 2021-04-29 DIAGNOSIS — Z1231 Encounter for screening mammogram for malignant neoplasm of breast: Secondary | ICD-10-CM | POA: Insufficient documentation

## 2021-05-30 ENCOUNTER — Other Ambulatory Visit: Payer: Self-pay | Admitting: Internal Medicine

## 2021-05-30 NOTE — Telephone Encounter (Signed)
Requested Prescriptions  Pending Prescriptions Disp Refills  . omeprazole (PRILOSEC) 10 MG capsule [Pharmacy Med Name: OMEPRAZOLE DR 10 MG CAPSULE] 90 capsule 0    Sig: TAKE 1 CAPSULE (10 MG TOTAL) BY MOUTH DAILY. ALTERNATE WITH TAGAMET.     Gastroenterology: Proton Pump Inhibitors Passed - 05/30/2021  9:19 AM      Passed - Valid encounter within last 12 months    Recent Outpatient Visits          1 month ago Annual physical exam   [facility] Glean Hess, MD   1 year ago Annual physical exam   [facility] Glean Hess, MD   2 years ago Annual physical exam   [facility] Glean Hess, MD   2 years ago Acute non-recurrent maxillary sinusitis   [facility] Glean Hess, MD   2 years ago Acute non-recurrent pansinusitis   [facility] Glean Hess, MD

## 2021-08-03 ENCOUNTER — Other Ambulatory Visit: Payer: Self-pay | Admitting: Internal Medicine

## 2021-08-03 DIAGNOSIS — K219 Gastro-esophageal reflux disease without esophagitis: Secondary | ICD-10-CM

## 2021-08-03 NOTE — Telephone Encounter (Signed)
Requested medications are due for refill today.  Unknown  Requested medications are on the active medications list.  no  Last refill. 12/06/2019  Future visit scheduled.   no  Notes to clinic.  Medication was discontinued 04/13/2021.

## 2021-08-24 ENCOUNTER — Other Ambulatory Visit: Payer: Self-pay | Admitting: Internal Medicine

## 2021-08-24 NOTE — Telephone Encounter (Signed)
Requested Prescriptions  Pending Prescriptions Disp Refills  . omeprazole (PRILOSEC) 10 MG capsule [Pharmacy Med Name: OMEPRAZOLE DR 10 MG CAPSULE] 90 capsule 0    Sig: TAKE 1 CAPSULE (10 MG TOTAL) BY MOUTH DAILY. ALTERNATE WITH TAGAMET.     Gastroenterology: Proton Pump Inhibitors Passed - 08/24/2021  8:30 AM      Passed - Valid encounter within last 12 months    Recent Outpatient Visits          4 months ago Annual physical exam   [facility] Glean Hess, MD   1 year ago Annual physical exam   [facility] Glean Hess, MD   2 years ago Annual physical exam   [facility] Glean Hess, MD   2 years ago Acute non-recurrent maxillary sinusitis   [facility] Glean Hess, MD   3 years ago Acute non-recurrent pansinusitis   [facility] Glean Hess, MD

## 2021-08-27 ENCOUNTER — Other Ambulatory Visit: Payer: Self-pay

## 2021-08-27 ENCOUNTER — Ambulatory Visit: Payer: 59 | Admitting: Internal Medicine

## 2021-08-27 ENCOUNTER — Encounter: Payer: Self-pay | Admitting: Internal Medicine

## 2021-08-27 VITALS — BP 104/68 | HR 85 | Temp 98.4°F | Ht 64.0 in | Wt 230.6 lb

## 2021-08-27 DIAGNOSIS — J0100 Acute maxillary sinusitis, unspecified: Secondary | ICD-10-CM

## 2021-08-27 DIAGNOSIS — C4492 Squamous cell carcinoma of skin, unspecified: Secondary | ICD-10-CM

## 2021-08-27 MED ORDER — AMOXICILLIN-POT CLAVULANATE 875-125 MG PO TABS: 1.0000 | ORAL_TABLET | Freq: Two times a day (BID) | ORAL | 0 refills | Status: AC

## 2021-08-27 NOTE — Progress Notes (Signed)
Date:  08/27/2021   Name:  Diane Vasquez   DOB:  21-Aug-1956   MRN:  [id number]   Chief Complaint: Sinusitis  Sinusitis This is a new problem. The current episode started in the past 7 days. The problem has been gradually worsening since onset. There has been no fever. The pain is mild. Associated symptoms include congestion, headaches, sinus pressure and sneezing. Pertinent negatives include no chills, coughing, shortness of breath or sore throat. [employer].) Past treatments include saline sprays and oral decongestants. The treatment provided mild relief. Patient has not had a Covid test since this started.  Lab Results  Component Value Date   CREATININE 0.94 04/13/2021   BUN 14 04/13/2021   NA 139 04/13/2021   K 4.5 04/13/2021   CL 104 04/13/2021   CO2 23 04/13/2021   Lab Results  Component Value Date   CHOL 209 (H) 04/13/2021   HDL 42 04/13/2021   LDLCALC 144 (H) 04/13/2021   TRIG 129 04/13/2021   CHOLHDL 5.0 (H) 04/13/2021   Lab Results  Component Value Date   TSH 2.280 04/13/2021   No results found for: HGBA1C Lab Results  Component Value Date   WBC 4.6 04/13/2021   HGB 14.3 04/13/2021   HCT 43.1 04/13/2021   MCV 95 04/13/2021   PLT 196 04/13/2021   Lab Results  Component Value Date   ALT 15 04/13/2021   AST 16 04/13/2021   ALKPHOS 85 04/13/2021   BILITOT 0.4 04/13/2021     Review of Systems  Constitutional:  Negative for chills, fatigue and fever.  HENT:  Positive for congestion, sinus pressure and sneezing. Negative for sore throat and trouble swallowing.   Respiratory:  Negative for cough, shortness of breath and wheezing.   Cardiovascular:  Negative for chest pain and palpitations.  Skin:  Positive for wound.  Neurological:  Positive for headaches.   Patient Active Problem List   Diagnosis Date Noted   Status post hysterectomy 12/02/2020   Squamous cell skin cancer 06/09/2020   Foot pain, right 04/03/2018   Vitamin D deficiency  04/03/2018   Hx of hematuria 10/01/2017   Obesity (BMI 30-39.9) 08/04/2017   Parathyroid adenoma 09/19/2016   History of multiple pulmonary nodules 03/17/2016   Chronic fatigue, unspecified 02/16/2016   Familial benign pemphigus 02/16/2016   Polyarthralgia 02/16/2016   GERD (gastroesophageal reflux disease) 02/03/2016   Allergic rhinitis 02/03/2016   Hypercholesterolemia 02/19/2014    Allergies  Allergen Reactions   Atorvastatin Other (See Comments)    myalgia   Lovastatin Other (See Comments)    Myalgia   Tramadol     Lips numb    Past Surgical History:  Procedure Laterality Date   ABDOMINAL HYSTERECTOMY     Still has ovaries. No cervix or uterus.   BREAST CYST ASPIRATION Right    neg   COLONOSCOPY  11/02/2009   normal - repeat 10 yrs   CTR     PARATHYROIDECTOMY Right 09/19/2016   Procedure: EXCISION PARATHYROID ADENOMA;  Surgeon: Margaretha Sheffield, MD;  Location: [facility] ORS;  Service: ENT;  Laterality: Right;   SINUS SURGERY WITH INSTATRAK     sqamous cell removal Right 02/12/2019   Dr. Phillip Heal. Surgery X 2    Social History   Tobacco Use   Smoking status: Never   Smokeless tobacco: Never  Vaping Use   Vaping Use: Never used  Substance Use Topics   Alcohol use: Yes    Comment: occ   Drug  use: No     Medication list has been reviewed and updated.  Current Meds  Medication Sig   amoxicillin-clavulanate (AUGMENTIN) 875-125 MG tablet Take 1 tablet by mouth 2 (two) times daily for 10 days.   Ascorbic Acid (VITAMIN C) 1000 MG tablet Take 1,000 mg by mouth daily.   cetirizine (ZYRTEC) 10 MG tablet Take 10 mg by mouth daily.   cimetidine (TAGAMET) 400 MG tablet TAKE 1 TABLET BY MOUTH TWICE A DAY   clobetasol (TEMOVATE) 0.05 % external solution clobetasol 0.05 % scalp solution  1(ONE) APPLICATION(S) SCALP EVERY DAY AS NEEDED FOR FLARES   dapsone 100 MG tablet Take 50 mg by mouth daily.    metroNIDAZOLE (METROGEL) 1 % gel Apply 1 application topically daily.     omeprazole (PRILOSEC) 10 MG capsule TAKE 1 CAPSULE (10 MG TOTAL) BY MOUTH DAILY. ALTERNATE WITH TAGAMET.   tretinoin (RETIN-A) 0.05 % cream Apply topically at bedtime.   Vitamin D, Ergocalciferol, (DRISDOL) 1.25 MG (50000 UNIT) CAPS capsule Take 1 capsule by mouth once a week.    PHQ 2/9 Scores 08/27/2021 04/13/2021 04/09/2020 04/09/2019  PHQ - 2 Score 0 0 0 0  PHQ- 9 Score 1 0 0 -    GAD 7 : Generalized Anxiety Score 08/27/2021 04/13/2021 04/09/2020  Nervous, Anxious, on Edge 0 0 0  Control/stop worrying 0 0 0  Worry too much - different things 0 0 0  Trouble relaxing 0 0 0  Restless 0 0 0  Easily annoyed or irritable 0 0 0  Afraid - awful might happen 0 0 0  Total GAD 7 Score 0 0 0  Anxiety Difficulty Not difficult at all - Not difficult at all    BP Readings from Last 3 Encounters:  08/27/21 104/68  04/13/21 106/74  04/09/20 128/72    Physical Exam Constitutional:      Appearance: She is well-developed.  HENT:     Right Ear: Ear canal and external ear normal. Tympanic membrane is not erythematous or retracted.     Left Ear: Ear canal and external ear normal. Tympanic membrane is not erythematous or retracted.     Nose:     Right Sinus: Maxillary sinus tenderness and frontal sinus tenderness present.     Left Sinus: Maxillary sinus tenderness and frontal sinus tenderness present.     Mouth/Throat:     Mouth: No oral lesions.  Cardiovascular:     Rate and Rhythm: Normal rate and regular rhythm.     Heart sounds: Normal heart sounds.  Pulmonary:     Breath sounds: Normal breath sounds. No wheezing or rales.  Lymphadenopathy:     Cervical: No cervical adenopathy.  Neurological:     Mental Status: She is alert and oriented to person, place, and time.    Wt Readings from Last 3 Encounters:  08/27/21 230 lb 9.6 oz (104.6 kg)  04/13/21 231 lb (104.8 kg)  04/09/20 230 lb (104.3 kg)    BP 104/68   Pulse 85   Temp 98.4 F (36.9 C) (Oral)   Ht 5\' 4"  (1.626 m)   Wt 230 lb  9.6 oz (104.6 kg)   SpO2 96%   BMI 39.58 kg/m   Assessment and Plan: 1. Acute non-recurrent maxillary sinusitis Continue Mucinex, Nasocort NS and neti pot flushes - amoxicillin-clavulanate (AUGMENTIN) 875-125 MG tablet; Take 1 tablet by mouth 2 (two) times daily for 10 days.  Dispense: 20 tablet; Refill: 0  2. Squamous cell skin cancer Recent excision of  2 lesions, one invasive Followed by Dr. Lacinda Axon   Partially dictated using Dragon software. Any errors are unintentional.  Halina Maidens, MD [facility]  08/27/2021

## 2021-11-15 ENCOUNTER — Other Ambulatory Visit: Payer: Self-pay | Admitting: Internal Medicine

## 2021-11-15 NOTE — Telephone Encounter (Signed)
Requested Prescriptions  Pending Prescriptions Disp Refills   omeprazole (PRILOSEC) 10 MG capsule [Pharmacy Med Name: OMEPRAZOLE DR 10 MG CAPSULE] 90 capsule 1    Sig: TAKE 1 CAPSULE (10 MG TOTAL) BY MOUTH DAILY. ALTERNATE WITH TAGAMET.     Gastroenterology: Proton Pump Inhibitors Passed - 11/15/2021 10:31 AM      Passed - Valid encounter within last 12 months    Recent Outpatient Visits          2 months ago Acute non-recurrent maxillary sinusitis   [facility] Glean Hess, MD   7 months ago Annual physical exam   [facility] Glean Hess, MD   1 year ago Annual physical exam   [facility] Glean Hess, MD   2 years ago Annual physical exam   [facility] Glean Hess, MD   3 years ago Acute non-recurrent maxillary sinusitis   [facility] Glean Hess, MD

## 2022-02-04 ENCOUNTER — Other Ambulatory Visit: Payer: Self-pay | Admitting: Internal Medicine

## 2022-02-04 DIAGNOSIS — K219 Gastro-esophageal reflux disease without esophagitis: Secondary | ICD-10-CM

## 2022-02-04 NOTE — Telephone Encounter (Signed)
Requested Prescriptions  ?Pending Prescriptions Disp Refills  ?? cimetidine (TAGAMET) 400 MG tablet [Pharmacy Med Name: CIMETIDINE 400 MG TABLET] 180 tablet 1  ?  Sig: TAKE 1 TABLET BY MOUTH TWICE A DAY  ?  ? Gastroenterology:  H2 Antagonists - cimetidine Passed - 02/04/2022  2:36 AM  ?  ?  Passed - Cr in normal range and within 360 days  ?  Creatinine, Ser  ?Date Value Ref Range Status  ?04/13/2021 0.94 0.57 - 1.00 mg/dL Final  ?   ?  ?  Passed - Valid encounter within last 12 months  ?  Recent Outpatient Visits   ?      ? 5 months ago Acute non-recurrent maxillary sinusitis  ? [facility] Glean Hess, MD  ? 9 months ago Annual physical exam  ? [facility] Glean Hess, MD  ? 1 year ago Annual physical exam  ? [facility] Glean Hess, MD  ? 2 years ago Annual physical exam  ? [facility] Glean Hess, MD  ? 3 years ago Acute non-recurrent maxillary sinusitis  ? [facility] Glean Hess, MD  ?  ?  ? ?  ?  ?  ? ?

## 2022-04-22 ENCOUNTER — Encounter: Payer: Self-pay | Admitting: Internal Medicine

## 2022-04-22 ENCOUNTER — Ambulatory Visit (INDEPENDENT_AMBULATORY_CARE_PROVIDER_SITE_OTHER): Payer: 59 | Admitting: Internal Medicine

## 2022-04-22 VITALS — BP 120/88 | HR 72 | Ht 64.0 in | Wt 230.0 lb

## 2022-04-22 DIAGNOSIS — E7800 Pure hypercholesterolemia, unspecified: Secondary | ICD-10-CM | POA: Diagnosis not present

## 2022-04-22 DIAGNOSIS — Z0000 Encounter for general adult medical examination without abnormal findings: Secondary | ICD-10-CM | POA: Diagnosis not present

## 2022-04-22 DIAGNOSIS — E559 Vitamin D deficiency, unspecified: Secondary | ICD-10-CM

## 2022-04-22 DIAGNOSIS — Z1231 Encounter for screening mammogram for malignant neoplasm of breast: Secondary | ICD-10-CM | POA: Diagnosis not present

## 2022-04-22 DIAGNOSIS — Q828 Other specified congenital malformations of skin: Secondary | ICD-10-CM

## 2022-04-22 DIAGNOSIS — K219 Gastro-esophageal reflux disease without esophagitis: Secondary | ICD-10-CM

## 2022-04-22 NOTE — Progress Notes (Signed)
Date:  04/22/2022   Name:  Diane Vasquez   DOB:  05/12/1956   MRN:  [id number]   Chief Complaint: Annual Exam (Breast Exam.) Diane Vasquez is a 66 y.o. female who presents today for her Complete Annual Exam. She feels well. She reports exercising none. She is busy with a vegetable garden She reports she is sleeping fairly well. Breast complaints - none.  Mammogram: 04/2021 DEXA: none Pap smear: discontinued Colonoscopy: 05/2020 repeat 10 yrs  Health Maintenance Due  Topic Date Due   Zoster Vaccines- Shingrix (1 of 2) Never done    Immunization History  Administered Date(s) Administered   Influenza,inj,Quad PF,6+ Mos 08/09/2016, 10/03/2017, 09/18/2018   Influenza-Unspecified 09/18/2018, 09/10/2019   Tdap 10/22/2013    Gastroesophageal Reflux She complains of heartburn. She reports no abdominal pain, no chest pain, no coughing or no wheezing. This is a recurrent problem. The problem occurs rarely. Pertinent negatives include no fatigue. She has tried a PPI and a histamine-2 antagonist for the symptoms.  Pemphigus - she is still on Dapsone, tried to wean down but unable.  Has several topical creams that she uses intermittently.  She continues to see Dermatology.   Lab Results  Component Value Date   NA 139 04/13/2021   K 4.5 04/13/2021   CO2 23 04/13/2021   GLUCOSE 91 04/13/2021   BUN 14 04/13/2021   CREATININE 0.94 04/13/2021   CALCIUM 9.2 04/13/2021   EGFR 69 04/13/2021   GFRNONAA 76 04/09/2020   Lab Results  Component Value Date   CHOL 209 (H) 04/13/2021   HDL 42 04/13/2021   LDLCALC 144 (H) 04/13/2021   TRIG 129 04/13/2021   CHOLHDL 5.0 (H) 04/13/2021   Lab Results  Component Value Date   TSH 2.280 04/13/2021   No results found for: "HGBA1C" Lab Results  Component Value Date   WBC 4.6 04/13/2021   HGB 14.3 04/13/2021   HCT 43.1 04/13/2021   MCV 95 04/13/2021   PLT 196 04/13/2021   Lab Results  Component Value Date   ALT 15 04/13/2021   AST 16  04/13/2021   ALKPHOS 85 04/13/2021   BILITOT 0.4 04/13/2021   No results found for: "25OHVITD2", "25OHVITD3", "VD25OH"   Review of Systems  Constitutional:  Negative for chills, fatigue and fever.  HENT:  Negative for congestion, hearing loss, tinnitus, trouble swallowing and voice change.   Eyes:  Negative for visual disturbance.  Respiratory:  Negative for cough, chest tightness, shortness of breath and wheezing.   Cardiovascular:  Negative for chest pain, palpitations and leg swelling.  Gastrointestinal:  Positive for heartburn. Negative for abdominal pain, constipation, diarrhea and vomiting.  Endocrine: Negative for polydipsia and polyuria.  Genitourinary:  Negative for dysuria, frequency, genital sores, vaginal bleeding and vaginal discharge.  Musculoskeletal:  Negative for arthralgias, gait problem and joint swelling.  Skin:  Negative for color change and rash.  Neurological:  Negative for dizziness, tremors, light-headedness and headaches.  Hematological:  Negative for adenopathy. Does not bruise/bleed easily.  Psychiatric/Behavioral:  Negative for dysphoric mood and sleep disturbance. The patient is not nervous/anxious.     Patient Active Problem List   Diagnosis Date Noted   Status post hysterectomy 12/02/2020   Squamous cell skin cancer 06/09/2020   Foot pain, right 04/03/2018   Vitamin D deficiency 04/03/2018   Hx of hematuria 10/01/2017   Obesity (BMI 30-39.9) 08/04/2017   Parathyroid adenoma 09/19/2016   History of multiple pulmonary nodules 03/17/2016   Chronic  fatigue, unspecified 02/16/2016   Familial benign pemphigus 02/16/2016   Polyarthralgia 02/16/2016   GERD (gastroesophageal reflux disease) 02/03/2016   Allergic rhinitis 02/03/2016   Hypercholesterolemia 02/19/2014    Allergies  Allergen Reactions   Atorvastatin Other (See Comments)    myalgia   Lovastatin Other (See Comments)    Myalgia   Tramadol     Lips numb    Past Surgical History:   Procedure Laterality Date   ABDOMINAL HYSTERECTOMY     Still has ovaries. No cervix or uterus.   BREAST CYST ASPIRATION Right    neg   COLONOSCOPY  11/02/2009   normal - repeat 10 yrs   CTR     PARATHYROIDECTOMY Right 09/19/2016   Procedure: EXCISION PARATHYROID ADENOMA;  Surgeon: Margaretha Sheffield, MD;  Location: [facility] ORS;  Service: ENT;  Laterality: Right;   SINUS SURGERY WITH INSTATRAK     sqamous cell removal Right 02/12/2019   Dr. Phillip Heal. Surgery X 2    Social History   Tobacco Use   Smoking status: Never   Smokeless tobacco: Never  Vaping Use   Vaping Use: Never used  Substance Use Topics   Alcohol use: Yes    Comment: occ   Drug use: No     Medication list has been reviewed and updated.  Current Meds  Medication Sig   Ascorbic Acid (VITAMIN C) 1000 MG tablet Take 1,000 mg by mouth daily.   cetirizine (ZYRTEC) 10 MG tablet Take 10 mg by mouth daily.   cimetidine (TAGAMET) 400 MG tablet TAKE 1 TABLET BY MOUTH TWICE A DAY   clindamycin (CLEOCIN T) 1 % external solution Apply topically daily.   clobetasol (TEMOVATE) 0.05 % external solution clobetasol 0.05 % scalp solution  1(ONE) APPLICATION(S) SCALP EVERY DAY AS NEEDED FOR FLARES   dapsone 100 MG tablet Take 50 mg by mouth daily.    halobetasol (ULTRAVATE) 0.05 % cream    metroNIDAZOLE (METROGEL) 1 % gel Apply 1 application topically daily.    omeprazole (PRILOSEC) 10 MG capsule TAKE 1 CAPSULE (10 MG TOTAL) BY MOUTH DAILY. ALTERNATE WITH TAGAMET.   tretinoin (RETIN-A) 0.05 % cream Apply topically at bedtime.   Vitamin D, Ergocalciferol, (DRISDOL) 1.25 MG (50000 UNIT) CAPS capsule Take 1 capsule by mouth once a week.       04/22/2022    8:39 AM 08/27/2021    9:27 AM 04/13/2021    8:38 AM 04/09/2020    8:16 AM  GAD 7 : Generalized Anxiety Score  Nervous, Anxious, on Edge 0 0 0 0  Control/stop worrying 0 0 0 0  Worry too much - different things 0 0 0 0  Trouble relaxing 0 0 0 0  Restless 0 0 0 0  Easily annoyed  or irritable 0 0 0 0  Afraid - awful might happen 0 0 0 0  Total GAD 7 Score 0 0 0 0  Anxiety Difficulty Not difficult at all Not difficult at all  Not difficult at all       04/22/2022    8:39 AM 08/27/2021    9:26 AM 04/13/2021    8:38 AM  Depression screen PHQ 2/9  Decreased Interest 0 0 0  Down, Depressed, Hopeless 0 0 0  PHQ - 2 Score 0 0 0  Altered sleeping 0 0 0  Tired, decreased energy 0 1 0  Change in appetite 0 0 0  Feeling bad or failure about yourself  0 0 0  Trouble concentrating 0 0  0  Moving slowly or fidgety/restless 0 0 0  Suicidal thoughts 0 0 0  PHQ-9 Score 0 1 0  Difficult doing work/chores Not difficult at all Not difficult at all Not difficult at all    BP Readings from Last 3 Encounters:  04/22/22 120/88  08/27/21 104/68  04/13/21 106/74    Physical Exam Vitals and nursing note reviewed.  Constitutional:      General: She is not in acute distress.    Appearance: She is well-developed.  HENT:     Head: Normocephalic and atraumatic.     Right Ear: Tympanic membrane and ear canal normal.     Left Ear: Tympanic membrane and ear canal normal.     Nose:     Right Sinus: No maxillary sinus tenderness.     Left Sinus: No maxillary sinus tenderness.  Eyes:     General: No scleral icterus.       Right eye: No discharge.        Left eye: No discharge.     Conjunctiva/sclera: Conjunctivae normal.  Neck:     Thyroid: No thyromegaly.     Vascular: No carotid bruit.  Cardiovascular:     Rate and Rhythm: Normal rate and regular rhythm.     Pulses: Normal pulses.     Heart sounds: Normal heart sounds.  Pulmonary:     Effort: Pulmonary effort is normal. No respiratory distress.     Breath sounds: No wheezing.  Chest:  Breasts:    Right: No mass, nipple discharge, skin change or tenderness.     Left: No mass, nipple discharge, skin change or tenderness.  Abdominal:     General: Bowel sounds are normal.     Palpations: Abdomen is soft.     Tenderness:  There is no abdominal tenderness.  Musculoskeletal:     Cervical back: Normal range of motion. No erythema.     Right lower leg: No edema.     Left lower leg: No edema.  Lymphadenopathy:     Cervical: No cervical adenopathy.  Skin:    General: Skin is warm and dry.     Findings: No rash.  Neurological:     Mental Status: She is alert and oriented to person, place, and time.     Cranial Nerves: No cranial nerve deficit.     Sensory: No sensory deficit.     Deep Tendon Reflexes: Reflexes are normal and symmetric.  Psychiatric:        Attention and Perception: Attention normal.        Mood and Affect: Mood normal.     Wt Readings from Last 3 Encounters:  04/22/22 230 lb (104.3 kg)  08/27/21 230 lb 9.6 oz (104.6 kg)  04/13/21 231 lb (104.8 kg)    BP 120/88   Pulse 72   Ht _0  (1.626 m)   Wt 230 lb (104.3 kg)   SpO2 96%   BMI 39.48 kg/m   Assessment and Plan: 1. Annual physical exam Exam is normal except for weight. Encourage regular exercise and appropriate dietary changes. Up to date on screenings and immunizations. - Comprehensive metabolic panel - TSH - Hemoglobin A1c  2. Encounter for screening mammogram for breast cancer Schedule at [facility] - MM 3D SCREEN BREAST BILATERAL  3. Gastroesophageal reflux disease, unspecified whether esophagitis present Symptoms well controlled on daily PPI alternating with H2 blocker No red flag signs such as weight loss, n/v, melena Will continue same therapy. - CBC with Differential/Platelet  4. Hypercholesterolemia Check labs and advise - Lipid panel  5. Vitamin D deficiency Continue supplementation - VITAMIN D 25 Hydroxy (Vit-D Deficiency, Fractures)  6. Familial benign pemphigus Followed by Dermatology On dapsone daily.   Partially dictated using Editor, commissioning. Any errors are unintentional.  Halina Maidens, MD [facility]  04/22/2022

## 2022-04-23 LAB — COMPREHENSIVE METABOLIC PANEL
ALT: 16 IU/L (ref 0–32)
AST: 15 IU/L (ref 0–40)
Albumin/Globulin Ratio: 2.2 (ref 1.2–2.2)
Albumin: 4.6 g/dL (ref 3.8–4.8)
Alkaline Phosphatase: 89 IU/L (ref 44–121)
BUN/Creatinine Ratio: 16 (ref 12–28)
BUN: 12 mg/dL (ref 8–27)
Bilirubin Total: 0.3 mg/dL (ref 0.0–1.2)
CO2: 23 mmol/L (ref 20–29)
Calcium: 9.2 mg/dL (ref 8.7–10.3)
Chloride: 107 mmol/L — ABNORMAL HIGH (ref 96–106)
Creatinine, Ser: 0.73 mg/dL (ref 0.57–1.00)
Globulin, Total: 2.1 g/dL (ref 1.5–4.5)
Glucose: 97 mg/dL (ref 70–99)
Potassium: 4.5 mmol/L (ref 3.5–5.2)
Sodium: 143 mmol/L (ref 134–144)
Total Protein: 6.7 g/dL (ref 6.0–8.5)
eGFR: 92 mL/min/{1.73_m2} (ref >59)

## 2022-04-23 LAB — CBC WITH DIFFERENTIAL/PLATELET
Basophils Absolute: 0.1 10*3/uL (ref 0.0–0.2)
Basos: 1 %
EOS (ABSOLUTE): 0.1 10*3/uL (ref 0.0–0.4)
Eos: 2 %
Hematocrit: 43.4 % (ref 34.0–46.6)
Hemoglobin: 14.2 g/dL (ref 11.1–15.9)
Immature Grans (Abs): 0 10*3/uL (ref 0.0–0.1)
Immature Granulocytes: 1 %
Lymphocytes Absolute: 1.7 10*3/uL (ref 0.7–3.1)
Lymphs: 40 %
MCH: 31.4 pg (ref 26.6–33.0)
MCHC: 32.7 g/dL (ref 31.5–35.7)
MCV: 96 fL (ref 79–97)
Monocytes Absolute: 0.4 10*3/uL (ref 0.1–0.9)
Monocytes: 9 %
Neutrophils Absolute: 2 10*3/uL (ref 1.4–7.0)
Neutrophils: 47 %
Platelets: 211 10*3/uL (ref 150–450)
RBC: 4.52 x10E6/uL (ref 3.77–5.28)
RDW: 12.6 % (ref 11.7–15.4)
WBC: 4.1 10*3/uL (ref 3.4–10.8)

## 2022-04-23 LAB — LIPID PANEL
Chol/HDL Ratio: 5.8 ratio — ABNORMAL HIGH (ref 0.0–4.4)
Cholesterol, Total: 215 mg/dL — ABNORMAL HIGH (ref 100–199)
HDL: 37 mg/dL — ABNORMAL LOW (ref >39)
LDL Chol Calc (NIH): 151 mg/dL — ABNORMAL HIGH (ref 0–99)
Triglycerides: 147 mg/dL (ref 0–149)
VLDL Cholesterol Cal: 27 mg/dL (ref 5–40)

## 2022-04-23 LAB — TSH: TSH: 2.87 u[IU]/mL (ref 0.450–4.500)

## 2022-04-23 LAB — HEMOGLOBIN A1C
Est. average glucose Bld gHb Est-mCnc: 103 mg/dL
Hgb A1c MFr Bld: 5.2 % (ref 4.8–5.6)

## 2022-04-23 LAB — VITAMIN D 25 HYDROXY (VIT D DEFICIENCY, FRACTURES): Vit D, 25-Hydroxy: 50.8 ng/mL (ref 30.0–100.0)

## 2022-05-17 ENCOUNTER — Ambulatory Visit
Admission: RE | Admit: 2022-05-17 | Discharge: 2022-05-17 | Disposition: A | Discharge status: Home / Self Care | Payer: 59 | Source: Ambulatory Visit | Attending: Internal Medicine | Admitting: Internal Medicine

## 2022-05-17 DIAGNOSIS — Z1231 Encounter for screening mammogram for malignant neoplasm of breast: Secondary | ICD-10-CM | POA: Diagnosis not present

## 2022-05-21 ENCOUNTER — Other Ambulatory Visit: Payer: Self-pay | Admitting: Internal Medicine

## 2022-05-23 NOTE — Telephone Encounter (Signed)
Requested Prescriptions  Pending Prescriptions Disp Refills  . omeprazole (PRILOSEC) 10 MG capsule [Pharmacy Med Name: OMEPRAZOLE DR 10 MG CAPSULE] 90 capsule 2    Sig: TAKE 1 CAPSULE (10 MG TOTAL) BY MOUTH DAILY. ALTERNATE WITH TAGAMET.     Gastroenterology: Proton Pump Inhibitors Passed - 05/21/2022  9:46 AM      Passed - Valid encounter within last 12 months    Recent Outpatient Visits          1 month ago Annual physical exam   [facility] Glean Hess, MD   8 months ago Acute non-recurrent maxillary sinusitis   [facility] Glean Hess, MD   1 year ago Annual physical exam   [facility] Glean Hess, MD   2 years ago Annual physical exam   [facility] Glean Hess, MD   3 years ago Annual physical exam   [facility] Glean Hess, MD      Future Appointments            In 11 months Army Melia Jesse Sans, MD [facility]

## 2022-08-12 ENCOUNTER — Ambulatory Visit: Payer: 59 | Admitting: Internal Medicine

## 2022-08-12 ENCOUNTER — Encounter: Payer: Self-pay | Admitting: Internal Medicine

## 2022-08-12 VITALS — BP 108/76 | HR 82 | Temp 98.1°F | Ht 64.0 in | Wt 229.0 lb

## 2022-08-12 DIAGNOSIS — J0100 Acute maxillary sinusitis, unspecified: Secondary | ICD-10-CM | POA: Diagnosis not present

## 2022-08-12 MED ORDER — PROMETHAZINE-DM 6.25-15 MG/5ML PO SYRP: 5.0000 mL | ORAL_SOLUTION | Freq: Four times a day (QID) | ORAL | 0 refills | Status: AC | PRN

## 2022-08-12 MED ORDER — AMOXICILLIN-POT CLAVULANATE 875-125 MG PO TABS: 1.0000 | ORAL_TABLET | Freq: Two times a day (BID) | ORAL | 0 refills | Status: AC

## 2022-08-12 NOTE — Progress Notes (Signed)
Date:  08/12/2022   Name:  Diane Vasquez   DOB:  11-01-1955   MRN:  [id number]   Chief Complaint: Sinusitis (Neg covid test this AM, coughing green mucous )  Sinus Problem This is a new problem. Episode onset: X 1 week. The problem is unchanged. There has been no fever. Her pain is at a severity of 5/10. The pain is mild. Associated symptoms include congestion, coughing, headaches, a hoarse voice, sinus pressure, sneezing and a sore throat. Pertinent negatives include no chills or shortness of breath. Treatments tried: netty pot, mucinex d. The treatment provided mild relief.    Lab Results  Component Value Date   NA 143 04/22/2022   K 4.5 04/22/2022   CO2 23 04/22/2022   GLUCOSE 97 04/22/2022   BUN 12 04/22/2022   CREATININE 0.73 04/22/2022   CALCIUM 9.2 04/22/2022   EGFR 92 04/22/2022   GFRNONAA 76 04/09/2020   Lab Results  Component Value Date   CHOL 215 (H) 04/22/2022   HDL 37 (L) 04/22/2022   LDLCALC 151 (H) 04/22/2022   TRIG 147 04/22/2022   CHOLHDL 5.8 (H) 04/22/2022   Lab Results  Component Value Date   TSH 2.870 04/22/2022   Lab Results  Component Value Date   HGBA1C 5.2 04/22/2022   Lab Results  Component Value Date   WBC 4.1 04/22/2022   HGB 14.2 04/22/2022   HCT 43.4 04/22/2022   MCV 96 04/22/2022   PLT 211 04/22/2022   Lab Results  Component Value Date   ALT 16 04/22/2022   AST 15 04/22/2022   ALKPHOS 89 04/22/2022   BILITOT 0.3 04/22/2022   Lab Results  Component Value Date   VD25OH 50.8 04/22/2022     Review of Systems  Constitutional:  Negative for chills, fatigue and fever.  HENT:  Positive for congestion, hoarse voice, sinus pressure, sneezing and sore throat.   Respiratory:  Positive for cough. Negative for chest tightness, shortness of breath and wheezing.   Gastrointestinal:  Negative for abdominal pain, nausea and vomiting.  Neurological:  Positive for headaches.  Psychiatric/Behavioral:  Positive for sleep disturbance  (from cough). Negative for dysphoric mood. The patient is not nervous/anxious.     Patient Active Problem List   Diagnosis Date Noted   Status post hysterectomy 12/02/2020   Squamous cell skin cancer 06/09/2020   Foot pain, right 04/03/2018   Vitamin D deficiency 04/03/2018   Hx of hematuria 10/01/2017   Obesity (BMI 30-39.9) 08/04/2017   Parathyroid adenoma 09/19/2016   History of multiple pulmonary nodules 03/17/2016   Chronic fatigue, unspecified 02/16/2016   Familial benign pemphigus 02/16/2016   Polyarthralgia 02/16/2016   GERD (gastroesophageal reflux disease) 02/03/2016   Allergic rhinitis 02/03/2016   Hypercholesterolemia 02/19/2014    Allergies  Allergen Reactions   Atorvastatin Other (See Comments)    myalgia   Lovastatin Other (See Comments)    Myalgia   Tramadol     Lips numb    Past Surgical History:  Procedure Laterality Date   ABDOMINAL HYSTERECTOMY     Still has ovaries. No cervix or uterus.   BREAST CYST ASPIRATION Right    neg   COLONOSCOPY  11/02/2009   normal - repeat 10 yrs   CTR     PARATHYROIDECTOMY Right 09/19/2016   Procedure: EXCISION PARATHYROID ADENOMA;  Surgeon: Vernie Murders, MD;  Location: [facility] ORS;  Service: ENT;  Laterality: Right;   SINUS SURGERY WITH INSTATRAK     sqamous cell  removal Right 02/12/2019   Dr. Phillip Heal. Surgery X 2    Social History   Tobacco Use   Smoking status: Never   Smokeless tobacco: Never  Vaping Use   Vaping Use: Never used  Substance Use Topics   Alcohol use: Yes    Comment: occ   Drug use: No     Medication list has been reviewed and updated.  Current Meds  Medication Sig   Ascorbic Acid (VITAMIN C) 1000 MG tablet Take 1,000 mg by mouth daily.   cetirizine (ZYRTEC) 10 MG tablet Take 10 mg by mouth daily.   cimetidine (TAGAMET) 400 MG tablet TAKE 1 TABLET BY MOUTH TWICE A DAY   clindamycin (CLEOCIN T) 1 % external solution Apply topically daily.   clobetasol (TEMOVATE) 0.05 % external solution  clobetasol 0.05 % scalp solution  1(ONE) APPLICATION(S) SCALP EVERY DAY AS NEEDED FOR FLARES   dapsone 100 MG tablet Take 50 mg by mouth daily.    halobetasol (ULTRAVATE) 0.05 % cream    metroNIDAZOLE (METROGEL) 1 % gel Apply 1 application topically daily.    omeprazole (PRILOSEC) 10 MG capsule TAKE 1 CAPSULE (10 MG TOTAL) BY MOUTH DAILY. ALTERNATE WITH TAGAMET.   pseudoephedrine-guaifenesin (MUCINEX D) 60-600 MG 12 hr tablet Take 1 tablet by mouth every 12 (twelve) hours.   SOOLANTRA 1 % CREA Apply topically at bedtime.   tretinoin (RETIN-A) 0.05 % cream Apply topically at bedtime.   Vitamin D, Ergocalciferol, (DRISDOL) 1.25 MG (50000 UNIT) CAPS capsule Take 1 capsule by mouth once a week.       04/22/2022    8:39 AM 08/27/2021    9:27 AM 04/13/2021    8:38 AM 04/09/2020    8:16 AM  GAD 7 : Generalized Anxiety Score  Nervous, Anxious, on Edge 0 0 0 0  Control/stop worrying 0 0 0 0  Worry too much - different things 0 0 0 0  Trouble relaxing 0 0 0 0  Restless 0 0 0 0  Easily annoyed or irritable 0 0 0 0  Afraid - awful might happen 0 0 0 0  Total GAD 7 Score 0 0 0 0  Anxiety Difficulty Not difficult at all Not difficult at all  Not difficult at all       04/22/2022    8:39 AM 08/27/2021    9:26 AM 04/13/2021    8:38 AM  Depression screen PHQ 2/9  Decreased Interest 0 0 0  Down, Depressed, Hopeless 0 0 0  PHQ - 2 Score 0 0 0  Altered sleeping 0 0 0  Tired, decreased energy 0 1 0  Change in appetite 0 0 0  Feeling bad or failure about yourself  0 0 0  Trouble concentrating 0 0 0  Moving slowly or fidgety/restless 0 0 0  Suicidal thoughts 0 0 0  PHQ-9 Score 0 1 0  Difficult doing work/chores Not difficult at all Not difficult at all Not difficult at all    BP Readings from Last 3 Encounters:  08/12/22 108/76  04/22/22 120/88  08/27/21 104/68    Physical Exam Constitutional:      Appearance: She is well-developed.  HENT:     Right Ear: Ear canal and external ear  normal. Tympanic membrane is erythematous and retracted.     Left Ear: Ear canal and external ear normal. Tympanic membrane is not erythematous or retracted.     Nose:     Right Sinus: Maxillary sinus tenderness present. No frontal  sinus tenderness.     Left Sinus: Maxillary sinus tenderness present. No frontal sinus tenderness.     Mouth/Throat:     Mouth: No oral lesions.     Pharynx: Uvula midline. Posterior oropharyngeal erythema present. No oropharyngeal exudate.  Cardiovascular:     Rate and Rhythm: Normal rate and regular rhythm.     Heart sounds: Normal heart sounds.  Pulmonary:     Breath sounds: Normal breath sounds. No wheezing or rales.  Lymphadenopathy:     Cervical: No cervical adenopathy.  Skin:    General: Skin is warm and dry.  Neurological:     Mental Status: She is alert and oriented to person, place, and time.     Wt Readings from Last 3 Encounters:  08/12/22 229 lb (103.9 kg)  04/22/22 230 lb (104.3 kg)  08/27/21 230 lb 9.6 oz (104.6 kg)    BP 108/76   Pulse 82   Temp 98.1 F (36.7 C) (Oral)   Ht $R'5\' 4"'cA$  (1.626 m)   Wt 229 lb (103.9 kg)   SpO2 97%   BMI 39.31 kg/m   Assessment and Plan: 1. Acute non-recurrent maxillary sinusitis Continue NetiPot rinses; Mucinex-D and Tylenol or Advil as needed - amoxicillin-clavulanate (AUGMENTIN) 875-125 MG tablet; Take 1 tablet by mouth 2 (two) times daily for 10 days.  Dispense: 20 tablet; Refill: 0 - promethazine-dextromethorphan (PROMETHAZINE-DM) 6.25-15 MG/5ML syrup; Take 5 mLs by mouth 4 (four) times daily as needed for up to 9 days for cough.  Dispense: 118 mL; Refill: 0   Partially dictated using Editor, commissioning. Any errors are unintentional.  Halina Maidens, MD [facility]  08/12/2022

## 2022-08-17 ENCOUNTER — Other Ambulatory Visit: Payer: Self-pay

## 2022-08-17 ENCOUNTER — Ambulatory Visit: Payer: Self-pay | Admitting: *Deleted

## 2022-08-17 MED ORDER — FLUCONAZOLE 150 MG PO TABS: 150.0000 mg | ORAL_TABLET | Freq: Once | ORAL | 0 refills | Status: AC

## 2022-08-17 NOTE — Telephone Encounter (Signed)
  Chief Complaint: cottage cheese discharge, perineal burning (mild) Symptoms: mild pelvic pressure Frequency: this am  Pertinent Negatives: Patient denies fever, itching, bleeding, pain with urination, injury to genital area or foreign body.  Disposition: '[]'$ ED /'[]'$ Urgent Care (no appt availability in office) / '[]'$ Appointment(In office/virtual)/ '[]'$  [place] [facility]/ '[]'$ Home Care/ '[]'$ Refused Recommended Disposition /'[]'$ [place] [facility]/ '[x]'$  Follow-up with PCP Additional Notes: Pt asking for Diflucan to be called in to [facility] [place]. Pt dx with sinus infection and was on abx Reason for Disposition  [1] Symptoms of a "yeast infection" (i.e., itchy, white discharge, not bad smelling) AND [2] not improved > 3 days following Care Advice  Answer Assessment - Initial Assessment Questions 1. DISCHARGE: "Describe the discharge." (e.g., white, yellow, green, gray, foamy, cottage cheese-like)     Cottage cheese 2. ODOR: "Is there a bad odor?"     no 3. ONSET: "When did the discharge begin?"     This am  4. RASH: "Is there a rash in the genital area?" If Yes, ask: "Describe it." (e.g., redness, blisters, sores, bumps)     Red rash, burning  5. ABDOMEN PAIN: "Are you having any abdomen pain?" If Yes, ask: "What does it feel like? " (e.g., crampy, dull, intermittent, constant)      Feels pelvic pressure 6. ABDOMEN PAIN SEVERITY: If present, ask: "How bad is it?" (e.g., Scale 1-10; mild, moderate, or severe)   - MILD (1-3): Doesn't interfere with normal activities, abdomen soft and not tender to touch.    - MODERATE (4-7): Interferes with normal activities or awakens from sleep, abdomen tender to touch.    - SEVERE (8-10): Excruciating pain, doubled over, unable to do any normal activities. (R/O peritonitis)      mild 7. CAUSE: "What do you think is causing the discharge?" "Have you had the same problem before? What happened then?"     Yeast infection 8. OTHER SYMPTOMS: "Do you have any other  symptoms?" (e.g., fever, itching, vaginal bleeding, pain with urination, injury to genital area, vaginal foreign body)     Burning perineal are not with urination 9. PREGNANCY: "Is there any chance you are pregnant?" "When was your last menstrual period?"     N/a  Protocols used: Vaginal Discharge-A-AH

## 2022-08-17 NOTE — Telephone Encounter (Signed)
Pt informed diflucan sent in. Told her if no better after diflucan pill then she will need to make appt to be seen. She verbalized understanding.  - Janai Maudlin

## 2022-08-17 NOTE — Telephone Encounter (Signed)
Summary: Yeast infection from antibiotic   Patient states she now has a yeast infection from the antibiotic she was taking for her sinus infection. Please assist patient further. [facility]   [id number] [place] [place] Phone: [phone] Fax: [phone]    Attempted to call patient- left message to call office

## 2022-10-28 ENCOUNTER — Ambulatory Visit: Payer: 59 | Admitting: Internal Medicine

## 2022-10-28 ENCOUNTER — Encounter: Payer: Self-pay | Admitting: Internal Medicine

## 2022-10-28 VITALS — BP 124/70 | HR 86 | Ht 64.0 in | Wt 221.0 lb

## 2022-10-28 DIAGNOSIS — M62838 Other muscle spasm: Secondary | ICD-10-CM

## 2022-10-28 MED ORDER — CARBAMAZEPINE ER 100 MG PO TB12: 100.0000 mg | ORAL_TABLET | Freq: Two times a day (BID) | ORAL | 0 refills | Status: DC

## 2022-10-28 NOTE — Progress Notes (Signed)
Date:  10/28/2022   Name:  Diane Vasquez   DOB:  24-Nov-1955   MRN:  [id number]   Chief Complaint: Twitching in Cheek  HPI Nerve Twitching: This is a new problem. Started 1 month ago. Constant twitching in Left Cheek. Patient did her own research and stopped drinking caffeine for the last two week, but this still did not help. Also, having issues with the left side of her scalp is tender intermittently- lasting about 30 seconds to a minute.   Onset without any prior event or illness.  Noted when looking in the mirror and seems to be constant.  Occasionally she feels the twitch but it is not painful.  No recent URI, no ear pain/decr hearing/tinnitus, no facial pain, HA, temporal tenderness, vision changes, focal weakness or numbness.  She has noticed some mild posterior neck muscle tenderness but that is newer. Lab Results  Component Value Date   NA 143 04/22/2022   K 4.5 04/22/2022   CO2 23 04/22/2022   GLUCOSE 97 04/22/2022   BUN 12 04/22/2022   CREATININE 0.73 04/22/2022   CALCIUM 9.2 04/22/2022   EGFR 92 04/22/2022   GFRNONAA 76 04/09/2020   Lab Results  Component Value Date   CHOL 215 (H) 04/22/2022   HDL 37 (L) 04/22/2022   LDLCALC 151 (H) 04/22/2022   TRIG 147 04/22/2022   CHOLHDL 5.8 (H) 04/22/2022   Lab Results  Component Value Date   TSH 2.870 04/22/2022   Lab Results  Component Value Date   HGBA1C 5.2 04/22/2022   Lab Results  Component Value Date   WBC 4.1 04/22/2022   HGB 14.2 04/22/2022   HCT 43.4 04/22/2022   MCV 96 04/22/2022   PLT 211 04/22/2022   Lab Results  Component Value Date   ALT 16 04/22/2022   AST 15 04/22/2022   ALKPHOS 89 04/22/2022   BILITOT 0.3 04/22/2022   Lab Results  Component Value Date   VD25OH 50.8 04/22/2022     Review of Systems  Constitutional:  Negative for chills, fatigue and fever.  HENT:  Positive for sinus pressure. Negative for dental problem, ear pain, facial swelling, mouth sores, trouble swallowing and  voice change.   Eyes:  Negative for visual disturbance.  Respiratory:  Negative for cough and shortness of breath.   Cardiovascular:  Negative for chest pain.  Neurological:  Negative for dizziness, weakness, light-headedness, numbness and headaches.  Psychiatric/Behavioral:  Negative for dysphoric mood and sleep disturbance. The patient is not nervous/anxious.     Patient Active Problem List   Diagnosis Date Noted   Status post hysterectomy 12/02/2020   Squamous cell skin cancer 06/09/2020   Foot pain, right 04/03/2018   Vitamin D deficiency 04/03/2018   Hx of hematuria 10/01/2017   Obesity (BMI 30-39.9) 08/04/2017   Parathyroid adenoma 09/19/2016   History of multiple pulmonary nodules 03/17/2016   Chronic fatigue, unspecified 02/16/2016   Familial benign pemphigus 02/16/2016   Polyarthralgia 02/16/2016   GERD (gastroesophageal reflux disease) 02/03/2016   Allergic rhinitis 02/03/2016   Hypercholesterolemia 02/19/2014    Allergies  Allergen Reactions   Atorvastatin Other (See Comments)    myalgia   Lovastatin Other (See Comments)    Myalgia   Tramadol     Lips numb    Past Surgical History:  Procedure Laterality Date   ABDOMINAL HYSTERECTOMY     Still has ovaries. No cervix or uterus.   BREAST CYST ASPIRATION Right    neg  COLONOSCOPY  11/02/2009   normal - repeat 10 yrs   CTR     PARATHYROIDECTOMY Right 09/19/2016   Procedure: EXCISION PARATHYROID ADENOMA;  Surgeon: Margaretha Sheffield, MD;  Location: [facility] ORS;  Service: ENT;  Laterality: Right;   SINUS SURGERY WITH INSTATRAK     sqamous cell removal Right 02/12/2019   Dr. Phillip Heal. Surgery X 2    Social History   Tobacco Use   Smoking status: Never   Smokeless tobacco: Never  Vaping Use   Vaping Use: Never used  Substance Use Topics   Alcohol use: Yes    Comment: occ   Drug use: No     Medication list has been reviewed and updated.  Current Meds  Medication Sig   Ascorbic Acid (VITAMIN C) 1000 MG  tablet Take 1,000 mg by mouth daily.   carbamazepine (TEGRETOL-XR) 100 MG 12 hr tablet Take 1 tablet (100 mg total) by mouth 2 (two) times daily. With food.   cetirizine (ZYRTEC) 10 MG tablet Take 10 mg by mouth daily.   cimetidine (TAGAMET) 400 MG tablet TAKE 1 TABLET BY MOUTH TWICE A DAY   clindamycin (CLEOCIN T) 1 % external solution Apply topically daily.   clobetasol (TEMOVATE) 0.05 % external solution clobetasol 0.05 % scalp solution  1(ONE) APPLICATION(S) SCALP EVERY DAY AS NEEDED FOR FLARES   dapsone 100 MG tablet Take 50 mg by mouth daily.    halobetasol (ULTRAVATE) 0.05 % cream    metroNIDAZOLE (METROGEL) 1 % gel Apply 1 application topically daily.    omeprazole (PRILOSEC) 10 MG capsule TAKE 1 CAPSULE (10 MG TOTAL) BY MOUTH DAILY. ALTERNATE WITH TAGAMET.   pseudoephedrine-guaifenesin (MUCINEX D) 60-600 MG 12 hr tablet Take 1 tablet by mouth every 12 (twelve) hours.   SOOLANTRA 1 % CREA Apply topically at bedtime.   tretinoin (RETIN-A) 0.05 % cream Apply topically at bedtime.   Vitamin D, Ergocalciferol, (DRISDOL) 1.25 MG (50000 UNIT) CAPS capsule Take 1 capsule by mouth once a week.       10/28/2022    9:21 AM 04/22/2022    8:39 AM 08/27/2021    9:27 AM 04/13/2021    8:38 AM  GAD 7 : Generalized Anxiety Score  Nervous, Anxious, on Edge 0 0 0 0  Control/stop worrying 0 0 0 0  Worry too much - different things 0 0 0 0  Trouble relaxing 0 0 0 0  Restless 0 0 0 0  Easily annoyed or irritable 0 0 0 0  Afraid - awful might happen 0 0 0 0  Total GAD 7 Score 0 0 0 0  Anxiety Difficulty Not difficult at all Not difficult at all Not difficult at all        10/28/2022    9:21 AM 04/22/2022    8:39 AM 08/27/2021    9:26 AM  Depression screen PHQ 2/9  Decreased Interest 0 0 0  Down, Depressed, Hopeless 0 0 0  PHQ - 2 Score 0 0 0  Altered sleeping 0 0 0  Tired, decreased energy 0 0 1  Change in appetite 0 0 0  Feeling bad or failure about yourself  0 0 0  Trouble concentrating 0 0  0  Moving slowly or fidgety/restless 0 0 0  Suicidal thoughts 0 0 0  PHQ-9 Score 0 0 1  Difficult doing work/chores Not difficult at all Not difficult at all Not difficult at all    BP Readings from Last 3 Encounters:  10/28/22 124/70  08/12/22 108/76  04/22/22 120/88    Physical Exam Constitutional:      Appearance: Normal appearance.  HENT:     Head: Normocephalic.     Jaw: There is normal jaw occlusion.      Comments: Regular contraction of left facial muscle noted    Right Ear: Tympanic membrane and ear canal normal.     Left Ear: Tympanic membrane and ear canal normal.     Nose:     Right Sinus: No maxillary sinus tenderness or frontal sinus tenderness.     Left Sinus: No maxillary sinus tenderness or frontal sinus tenderness.     Mouth/Throat:     Lips: No lesions.     Mouth: Mucous membranes are moist.  Neck:     Vascular: No carotid bruit.  Cardiovascular:     Rate and Rhythm: Normal rate and regular rhythm.     Heart sounds: No murmur heard. Pulmonary:     Effort: Pulmonary effort is normal.     Breath sounds: No wheezing or rhonchi.  Musculoskeletal:     Cervical back: Normal range of motion.  Lymphadenopathy:     Cervical: No cervical adenopathy.  Neurological:     Mental Status: She is alert.     Cranial Nerves: Cranial nerves 2-12 are intact.     Sensory: Sensation is intact.     Motor: Motor function is intact.     Coordination: Coordination is intact.     Wt Readings from Last 3 Encounters:  10/28/22 221 lb (100.2 kg)  08/12/22 229 lb (103.9 kg)  04/22/22 230 lb (104.3 kg)    BP 124/70   Pulse 86   Ht '5\' 4"'$  (1.626 m)   Wt 221 lb (100.2 kg)   SpO2 96%   BMI 37.93 kg/m   Assessment and Plan: Problem List Items Addressed This Visit   None Visit Diagnoses     Muscle spasm    -  Primary   of the left face persistent No worrisome features Trial of tegretol bid for one week and consider imaging if no improvement   Relevant Medications    carbamazepine (TEGRETOL-XR) 100 MG 12 hr tablet   Other Relevant Orders   Comprehensive metabolic panel   Magnesium        Partially dictated using Editor, commissioning. Any errors are unintentional.  Halina Maidens, MD [facility]  10/28/2022

## 2022-10-29 LAB — MAGNESIUM: Magnesium: 2.1 mg/dL (ref 1.6–2.3)

## 2022-10-29 LAB — COMPREHENSIVE METABOLIC PANEL
ALT: 17 IU/L (ref 0–32)
AST: 17 IU/L (ref 0–40)
Albumin/Globulin Ratio: 2 (ref 1.2–2.2)
Albumin: 4.5 g/dL (ref 3.9–4.9)
Alkaline Phosphatase: 84 IU/L (ref 44–121)
BUN/Creatinine Ratio: 12 (ref 12–28)
BUN: 9 mg/dL (ref 8–27)
Bilirubin Total: 0.5 mg/dL (ref 0.0–1.2)
CO2: 23 mmol/L (ref 20–29)
Calcium: 9.3 mg/dL (ref 8.7–10.3)
Chloride: 105 mmol/L (ref 96–106)
Creatinine, Ser: 0.78 mg/dL (ref 0.57–1.00)
Globulin, Total: 2.2 g/dL (ref 1.5–4.5)
Glucose: 100 mg/dL — ABNORMAL HIGH (ref 70–99)
Potassium: 4.6 mmol/L (ref 3.5–5.2)
Sodium: 142 mmol/L (ref 134–144)
Total Protein: 6.7 g/dL (ref 6.0–8.5)
eGFR: 85 mL/min/{1.73_m2} (ref >59)

## 2022-10-31 ENCOUNTER — Other Ambulatory Visit: Payer: Self-pay

## 2022-10-31 DIAGNOSIS — G514 Facial myokymia: Secondary | ICD-10-CM

## 2022-11-22 ENCOUNTER — Other Ambulatory Visit: Payer: Self-pay | Admitting: Student

## 2022-11-22 DIAGNOSIS — G245 Blepharospasm: Secondary | ICD-10-CM

## 2022-11-30 ENCOUNTER — Ambulatory Visit
Admission: RE | Admit: 2022-11-30 | Discharge: 2022-11-30 | Disposition: A | Discharge status: Home / Self Care | Payer: 59 | Source: Ambulatory Visit | Attending: Student | Admitting: Student

## 2022-11-30 DIAGNOSIS — G245 Blepharospasm: Secondary | ICD-10-CM | POA: Diagnosis not present

## 2023-01-25 ENCOUNTER — Ambulatory Visit: Payer: Self-pay

## 2023-01-25 NOTE — Telephone Encounter (Signed)
  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 Last Tdap was 09/2013. Advised pt she would be ok until this year for updated Tdap. Advised pt care advise and she verbalized understanding. No further assistance needed.   Summary: Scratch on arm/Tetanus shot   Patient states that she was moving some deck boards today and a rusty nail scratched the side of her arm. Patient wants to know if she needs to come in for a tetanus shot. Patient's records show last Tdap given on 10/22/2013.       Reason for Disposition  Minor cut or scratch  Answer Assessment - Initial Assessment Questions 2. SIZE: "How large is the cut?"      3-4" 3. BLEEDING: "Is it bleeding now?" If Yes, ask: "Is it difficult to stop?"      no 4. LOCATION: "Where is the injury located?"      L arm lower near hand  5. ONSET: "How long ago did the injury occur?"      Monday  6. MECHANISM: "Tell me how it happened."      Was moving some decking boards and got cut by rusty nails  7. TETANUS: "When was the last tetanus booster?"     10/22/2013  Protocols used: Cuts and [employer]

## 2023-02-27 ENCOUNTER — Other Ambulatory Visit: Payer: Self-pay | Admitting: Internal Medicine

## 2023-02-27 DIAGNOSIS — K219 Gastro-esophageal reflux disease without esophagitis: Secondary | ICD-10-CM

## 2023-02-28 ENCOUNTER — Other Ambulatory Visit: Payer: Self-pay | Admitting: Internal Medicine

## 2023-02-28 DIAGNOSIS — K219 Gastro-esophageal reflux disease without esophagitis: Secondary | ICD-10-CM

## 2023-02-28 MED ORDER — CIMETIDINE 400 MG PO TABS: 400.0000 mg | ORAL_TABLET | Freq: Two times a day (BID) | ORAL | 0 refills | Status: DC

## 2023-02-28 NOTE — Telephone Encounter (Signed)
Requested Prescriptions  Pending Prescriptions Disp Refills   cimetidine (TAGAMET) 400 MG tablet [Pharmacy Med Name: CIMETIDINE 400 MG TABLET] 180 tablet 0    Sig: TAKE 1 TABLET BY MOUTH TWICE A DAY     Gastroenterology:  H2 Antagonists - cimetidine Passed - 02/27/2023  9:45 AM      Passed - Cr in normal range and within 360 days    Creatinine, Ser  Date Value Ref Range Status  10/28/2022 0.78 0.57 - 1.00 mg/dL Final         Passed - Valid encounter within last 12 months    Recent Outpatient Visits           4 months ago Muscle spasm   [place] [facility], Nyoka Cowden, MD   6 months ago Acute non-recurrent maxillary sinusitis   [place] [facility] at [facility], Nyoka Cowden, MD   10 months ago Annual physical exam   [facility] at [facility], Nyoka Cowden, MD   1 year ago Acute non-recurrent maxillary sinusitis   [place] [facility] at [facility], Nyoka Cowden, MD   1 year ago Annual physical exam   [facility] at [facility], Nyoka Cowden, MD       Future Appointments             In 2 months Judithann Graves, Nyoka Cowden, MD [facility] at [facility], [facility]

## 2023-05-01 ENCOUNTER — Encounter: Payer: Self-pay | Admitting: Internal Medicine

## 2023-05-01 ENCOUNTER — Ambulatory Visit (INDEPENDENT_AMBULATORY_CARE_PROVIDER_SITE_OTHER): Payer: 59 | Admitting: Internal Medicine

## 2023-05-01 VITALS — BP 122/64 | HR 73 | Ht 64.0 in | Wt 229.0 lb

## 2023-05-01 DIAGNOSIS — D351 Benign neoplasm of parathyroid gland: Secondary | ICD-10-CM

## 2023-05-01 DIAGNOSIS — Z1231 Encounter for screening mammogram for malignant neoplasm of breast: Secondary | ICD-10-CM | POA: Diagnosis not present

## 2023-05-01 DIAGNOSIS — Z0000 Encounter for general adult medical examination without abnormal findings: Secondary | ICD-10-CM | POA: Diagnosis not present

## 2023-05-01 DIAGNOSIS — E7800 Pure hypercholesterolemia, unspecified: Secondary | ICD-10-CM

## 2023-05-01 DIAGNOSIS — K219 Gastro-esophageal reflux disease without esophagitis: Secondary | ICD-10-CM | POA: Diagnosis not present

## 2023-05-01 DIAGNOSIS — G5139 Clonic hemifacial spasm, unspecified: Secondary | ICD-10-CM

## 2023-05-01 DIAGNOSIS — E559 Vitamin D deficiency, unspecified: Secondary | ICD-10-CM

## 2023-05-01 NOTE — Assessment & Plan Note (Signed)
Doing well with intermittent acu-puncture treatments

## 2023-05-01 NOTE — Assessment & Plan Note (Signed)
Lipids managed with diet alone. 10 yr risk of CAD is low.

## 2023-05-01 NOTE — Patient Instructions (Signed)
Call [facility] to schedule your mammogram at [phone].  

## 2023-05-01 NOTE — Progress Notes (Signed)
Date:  05/01/2023   Name:  Diane Vasquez   DOB:  [birth year]   MRN:  [id number]   Chief Complaint: Annual Exam Diane Vasquez is a 67 y.o. female who presents today for her Complete Annual Exam. She feels well. She reports exercising some. She reports she is sleeping well. Breast complaints - none.  Mammogram: 04/2022 DEXA: 2018 normal Pap smear: discontinued Colonoscopy: 05/2020 repeat 10 yrs  Health Maintenance Due  Topic Date Due   COVID-19 Vaccine (1) Never done   Zoster Vaccines- Shingrix (1 of 2) Never done    Immunization History  Administered Date(s) Administered   Influenza,inj,Quad PF,6+ Mos 08/09/2016, 10/03/2017, 09/18/2018   Influenza-Unspecified 09/18/2018, 09/10/2019   Tdap 10/22/2013    Gastroesophageal Reflux She complains of heartburn. She reports no abdominal pain, no chest pain, no coughing or no wheezing. This is a recurrent problem. The problem occurs rarely. Pertinent negatives include no fatigue. She has tried a PPI for the symptoms.  Hyperlipidemia This is a chronic problem. Recent lipid tests were reviewed and are high. Pertinent negatives include no chest pain or shortness of breath. Current antihyperlipidemic treatment includes diet change and exercise.  Facial spasm - did not tolerated tegretol or gabapentin.  MRI was negative.  Neurology consulted.  She is now doing acu-puncture with benefit. (Dr. 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 Acne Rosacea - followed by Dermatology; symptoms stable with topical agents.  Lab Results  Component Value Date   NA 142 10/28/2022   K 4.6 10/28/2022   CO2 23 10/28/2022   GLUCOSE 100 (H) 10/28/2022   BUN 9 10/28/2022   CREATININE 0.78 10/28/2022   CALCIUM 9.3 10/28/2022   EGFR 85 10/28/2022   GFRNONAA 76 04/09/2020   Lab Results  Component Value Date   CHOL 215 (H) 04/22/2022   HDL 37 (L) 04/22/2022   LDLCALC 151 (H) 04/22/2022   TRIG  147 04/22/2022   CHOLHDL 5.8 (H) 04/22/2022   Lab Results  Component Value Date   TSH 2.870 04/22/2022   Lab Results  Component Value Date   HGBA1C 5.2 04/22/2022   Lab Results  Component Value Date   WBC 4.1 04/22/2022   HGB 14.2 04/22/2022   HCT 43.4 04/22/2022   MCV 96 04/22/2022   PLT 211 04/22/2022   Lab Results  Component Value Date   ALT 17 10/28/2022   AST 17 10/28/2022   ALKPHOS 84 10/28/2022   BILITOT 0.5 10/28/2022   Lab Results  Component Value Date   VD25OH 50.8 04/22/2022     Review of Systems  Constitutional:  Negative for chills, fatigue and fever.  HENT:  Negative for congestion, hearing loss, tinnitus, trouble swallowing and voice change.   Eyes:  Negative for visual disturbance.  Respiratory:  Negative for cough, chest tightness, shortness of breath and wheezing.   Cardiovascular:  Negative for chest pain, palpitations and leg swelling.  Gastrointestinal:  Positive for heartburn. Negative for abdominal pain, constipation, diarrhea and vomiting.  Endocrine: Negative for polydipsia and polyuria.  Genitourinary:  Negative for dysuria, frequency, genital sores, vaginal bleeding and vaginal discharge.  Musculoskeletal:  Negative for arthralgias, gait problem and joint swelling.  Skin:  Negative for color change and rash.  Neurological:  Negative for dizziness, tremors, light-headedness and headaches.  Hematological:  Negative for adenopathy. Does not bruise/bleed easily.  Psychiatric/Behavioral:  Negative for dysphoric mood and sleep disturbance. The patient is not nervous/anxious.  Patient Active Problem List   Diagnosis Date Noted   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/01/2023   Status post hysterectomy 12/02/2020   Squamous cell skin cancer 06/09/2020   Vitamin D deficiency 04/03/2018   Hx of hematuria 10/01/2017   Obesity (BMI 30-39.9) 08/04/2017   Parathyroid adenoma 09/19/2016   History of multiple pulmonary nodules 03/17/2016   Chronic fatigue,  unspecified 02/16/2016   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 No cervix or uterus.   BREAST CYST ASPIRATION Right    neg   COLONOSCOPY  11/02/2009   normal - repeat 10 yrs   CTR     PARATHYROIDECTOMY Right 09/19/2016   Procedure: EXCISION PARATHYROID ADENOMA;  Surgeon: Vernie Murders, MD;  Location: [facility];  Service: ENT;  Laterality: Right;   SINUS SURGERY WITH INSTATRAK     sqamous cell removal Right 02/12/2019   Dr. Cheree Ditto. Surgery X 2    Social History   Tobacco Use   Smoking status: Never   Smokeless tobacco: Never  Vaping Use   Vaping Use: Never used  Substance Use Topics   Alcohol use: Yes    Comment: occ   Drug use: No     Medication list has been reviewed and updated.  Current Meds  Medication Sig   Ascorbic Acid (VITAMIN C) 1000 MG tablet Take 1,000 mg by mouth daily.   cetirizine (ZYRTEC) 10 MG tablet Take 10 mg by mouth daily.   cimetidine (TAGAMET) 400 MG tablet Take 1 tablet (400 mg total) by mouth 2 (two) times daily.   clindamycin (CLEOCIN T) 1 % external solution Apply topically daily.   clobetasol (TEMOVATE) 0.05 % external solution clobetasol 0.05 % scalp solution  1(ONE) APPLICATION(S) SCALP EVERY DAY AS NEEDED FOR FLARES   dapsone 100 MG tablet Take 50 mg by mouth daily.    metroNIDAZOLE (METROGEL) 1 % gel Apply 1 application topically daily.    tretinoin (RETIN-A) 0.05 % cream Apply topically at bedtime.   Vitamin D, Ergocalciferol, (DRISDOL) 1.25 MG (50000 UNIT) CAPS capsule Take 1 capsule by mouth once a week.   [DISCONTINUED] halobetasol  (ULTRAVATE) 0.05 % cream        05/01/2023    8:07 AM 10/28/2022    9:21 AM 04/22/2022    8:39 AM 08/27/2021    9:27 AM  GAD 7 : Generalized Anxiety Score  Nervous, Anxious, on Edge 0 0 0 0  Control/stop worrying 0 0 0 0  Worry too much - different things 0 0 0 0  Trouble relaxing 0 0 0 0  Restless 0 0 0 0  Easily annoyed or irritable 0 0 0 0  Afraid - awful might happen 0 0 0 0  Total GAD 7 Score 0 0 0 0  Anxiety Difficulty Not difficult at all Not difficult at all Not difficult at all Not difficult at all       05/01/2023    8:07 AM 10/28/2022    9:21 AM 04/22/2022    8:39 AM  Depression screen PHQ 2/9  Decreased Interest 0 0 0  Down, Depressed, Hopeless 0 0 0  PHQ - 2 Score 0 0 0  Altered sleeping 0 0 0  Tired, decreased energy 0 0 0  Change in appetite 0 0 0  Feeling bad or failure about yourself  0 0 0  Trouble concentrating 0 0 0  Moving slowly or fidgety/restless 0 0 0  Suicidal thoughts 0 0 0  PHQ-9 Score 0 0 0  Difficult doing work/chores Not difficult at all Not difficult at all Not difficult at all    BP Readings from Last 3 Encounters:  05/01/23 122/64  10/28/22 124/70  08/12/22 108/76    Physical Exam Vitals and nursing note reviewed.  Constitutional:      General: She is not in acute distress.    Appearance: She is well-developed.  HENT:     Head: Normocephalic and atraumatic.     Right Ear: Tympanic membrane and ear canal normal.     Left Ear: Tympanic membrane and ear canal normal.     Nose:     Right Sinus: No maxillary sinus tenderness.     Left Sinus: No maxillary sinus tenderness.  Eyes:     General: No scleral icterus.       Right eye: No discharge.        Left eye: No discharge.     Conjunctiva/sclera: Conjunctivae normal.  Neck:     Thyroid: No thyromegaly.     Vascular: No carotid bruit.  Cardiovascular:     Rate and Rhythm: Normal rate and regular rhythm.     Pulses: Normal pulses.     Heart sounds: Normal heart sounds.  Pulmonary:      Effort: Pulmonary effort is normal. No respiratory distress.     Breath sounds: No wheezing.  Chest:  Breasts:    Right: No mass, nipple discharge, skin change or tenderness.     Left: No mass, nipple discharge, skin change or tenderness.  Abdominal:     General: Bowel sounds are normal.     Palpations: Abdomen is soft.     Tenderness: There is no abdominal tenderness.  Musculoskeletal:     Cervical back: Normal range of motion. No erythema.     Right lower leg: No edema.     Left lower leg: No edema.  Lymphadenopathy:     Cervical: No cervical adenopathy.  Skin:    General: Skin is warm and dry.     Findings: No rash.  Neurological:     Mental Status: She is alert and oriented to person, place, and time.     Cranial Nerves: No cranial nerve deficit.     Sensory: No sensory deficit.     Deep Tendon Reflexes: Reflexes are normal and symmetric.  Psychiatric:        Attention and Perception: Attention normal.        Mood and Affect: Mood normal.     Wt Readings from Last 3 Encounters:  05/01/23 229 lb (103.9 kg)  10/28/22 221 lb (100.2 kg)  08/12/22 229 lb (103.9 kg)    BP 122/64   Pulse 73   Ht 5\' 4"  (1.626 m)   Wt 229 lb (103.9 kg)   SpO2 96%   BMI 39.31 kg/m   Assessment and Plan:  Problem List Items Addressed This Visit     Vitamin D deficiency (Chronic)    On high dose weekly vitamin D.      Relevant Orders   VITAMIN D 25 Hydroxy (Vit-D Deficiency, Fractures)  Parathyroid adenoma    Resected in 2017 On vitamin D supplement daily.      Relevant Orders   Comprehensive metabolic panel   Hypercholesterolemia (Chronic)    Lipids managed with diet alone. 10 yr risk of CAD is low.      Relevant Orders   Lipid panel   GERD (gastroesophageal reflux disease) (Chronic)    Reflux symptoms are minimal on current therapy - pepcid daily. Recent flare up treated with 2 week of omeprazole with improvement. No red flag signs such as weight loss, n/v,  melena       Relevant Orders   CBC with Differential/Platelet   Facial nerve spasm    Doing well with intermittent acu-puncture treatments      Other Visit Diagnoses     Annual physical exam    -  Primary   Relevant Orders   CBC with Differential/Platelet   Comprehensive metabolic panel   Lipid panel   TSH   Encounter for screening mammogram for breast cancer       Relevant Orders   MM 3D SCREENING MAMMOGRAM BILATERAL BREAST       Return in about 1 year (around 04/30/2024) for CPX.   Partially dictated using Dragon software, any errors are not intentional.  Reubin Milan, MD [facility] [place], [place]

## 2023-05-01 NOTE — Assessment & Plan Note (Signed)
On high dose weekly vitamin D.

## 2023-05-01 NOTE — Assessment & Plan Note (Addendum)
Reflux symptoms are minimal on current therapy - pepcid daily. Recent flare up treated with 2 week of omeprazole with improvement. No red flag signs such as weight loss, n/v, melena

## 2023-05-01 NOTE — Assessment & Plan Note (Addendum)
Resected in 2017 On vitamin D supplement daily.

## 2023-05-02 LAB — COMPREHENSIVE METABOLIC PANEL
ALT: 19 IU/L (ref 0–32)
AST: 19 IU/L (ref 0–40)
Albumin: 4.3 g/dL (ref 3.9–4.9)
Alkaline Phosphatase: 88 IU/L (ref 44–121)
BUN/Creatinine Ratio: 13 (ref 12–28)
BUN: 11 mg/dL (ref 8–27)
Bilirubin Total: 0.4 mg/dL (ref 0.0–1.2)
CO2: 24 mmol/L (ref 20–29)
Calcium: 9.3 mg/dL (ref 8.7–10.3)
Chloride: 106 mmol/L (ref 96–106)
Creatinine, Ser: 0.88 mg/dL (ref 0.57–1.00)
Globulin, Total: 2.3 g/dL (ref 1.5–4.5)
Glucose: 99 mg/dL (ref 70–99)
Potassium: 4.7 mmol/L (ref 3.5–5.2)
Sodium: 143 mmol/L (ref 134–144)
Total Protein: 6.6 g/dL (ref 6.0–8.5)
eGFR: 73 mL/min/{1.73_m2} (ref >59)

## 2023-05-02 LAB — CBC WITH DIFFERENTIAL/PLATELET
Basophils Absolute: 0.1 10*3/uL (ref 0.0–0.2)
Basos: 1 %
EOS (ABSOLUTE): 0.1 10*3/uL (ref 0.0–0.4)
Eos: 3 %
Hematocrit: 45.2 % (ref 34.0–46.6)
Hemoglobin: 14.8 g/dL (ref 11.1–15.9)
Immature Grans (Abs): 0 10*3/uL (ref 0.0–0.1)
Immature Granulocytes: 1 %
Lymphocytes Absolute: 1.8 10*3/uL (ref 0.7–3.1)
Lymphs: 38 %
MCH: 30.5 pg (ref 26.6–33.0)
MCHC: 32.7 g/dL (ref 31.5–35.7)
MCV: 93 fL (ref 79–97)
Monocytes Absolute: 0.4 10*3/uL (ref 0.1–0.9)
Monocytes: 9 %
Neutrophils Absolute: 2.4 10*3/uL (ref 1.4–7.0)
Neutrophils: 48 %
Platelets: 209 10*3/uL (ref 150–450)
RBC: 4.86 x10E6/uL (ref 3.77–5.28)
RDW: 13.4 % (ref 11.7–15.4)
WBC: 4.8 10*3/uL (ref 3.4–10.8)

## 2023-05-02 LAB — LIPID PANEL
Chol/HDL Ratio: 4.8 ratio — ABNORMAL HIGH (ref 0.0–4.4)
Cholesterol, Total: 210 mg/dL — ABNORMAL HIGH (ref 100–199)
HDL: 44 mg/dL (ref >39)
LDL Chol Calc (NIH): 139 mg/dL — ABNORMAL HIGH (ref 0–99)
Triglycerides: 149 mg/dL (ref 0–149)
VLDL Cholesterol Cal: 27 mg/dL (ref 5–40)

## 2023-05-02 LAB — VITAMIN D 25 HYDROXY (VIT D DEFICIENCY, FRACTURES): Vit D, 25-Hydroxy: 39.7 ng/mL (ref 30.0–100.0)

## 2023-05-02 LAB — TSH: TSH: 2.53 u[IU]/mL (ref 0.450–4.500)

## 2023-05-20 ENCOUNTER — Other Ambulatory Visit: Payer: Self-pay | Admitting: Internal Medicine

## 2023-05-30 ENCOUNTER — Ambulatory Visit
Admission: RE | Admit: 2023-05-30 | Discharge: 2023-05-30 | Disposition: A | Discharge status: Home / Self Care | Payer: 59 | Source: Ambulatory Visit | Attending: Internal Medicine | Admitting: Internal Medicine

## 2023-05-30 DIAGNOSIS — Z1231 Encounter for screening mammogram for malignant neoplasm of breast: Secondary | ICD-10-CM | POA: Diagnosis not present

## 2023-06-05 ENCOUNTER — Ambulatory Visit: Payer: Self-pay

## 2023-06-05 NOTE — Telephone Encounter (Signed)
  Chief Complaint: Red boil on breast Symptoms: above Frequency: weds or thur  Pertinent Negatives: Patient denies fever, chills Disposition: [] ED /[] Urgent Care (no appt availability in office) / [x] Appointment(In office/virtual)/ []  [place] [facility]/ [] Home Care/ [] Refused Recommended Disposition /[] [place] [facility]/ []  Follow-up with PCP Additional Notes: Pt has a 3 inch red spot on her breast that she believes is a boil. She has had these before. Derm is on vacation this week.   Reason for Disposition  Boil > 2 inches across (> 5 cm; larger than a golf ball or ping pong ball)  Answer Assessment - Initial Assessment Questions 1. APPEARANCE of BOIL: "What does the boil look like?"      Red 3 inches 2. LOCATION: "Where is the boil located?"      Left lower breast 3. NUMBER: "How many boils are there?"      1 4. SIZE: "How big is the boil?" (e.g., inches, cm; compare to size of a coin or other object)     3 inches 5. ONSET: "When did the boil start?"     After 8/6 - weds or thurs 6. PAIN: "Is there any pain?" If Yes, ask: "How bad is the pain?"   (Scale 1-10; or mild, moderate, severe)     Sore -  7. FEVER: "Do you have a fever?" If Yes, ask: "What is it, how was it measured, and when did it start?"      no 8. SOURCE: "Have you been around anyone with boils or other Staph infections?" "Have you ever had boils before?"     yes 9. OTHER SYMPTOMS: "Do you have any other symptoms?" (e.g., shaking chills, weakness, rash elsewhere on body)     no  Protocols used: Boil (Skin Abscess)-A-AH

## 2023-06-06 ENCOUNTER — Ambulatory Visit: Payer: 59 | Admitting: Internal Medicine

## 2023-06-06 ENCOUNTER — Encounter: Payer: Self-pay | Admitting: Internal Medicine

## 2023-06-06 VITALS — BP 104/60 | HR 82 | Ht 64.0 in | Wt 229.0 lb

## 2023-06-06 DIAGNOSIS — N611 Abscess of the breast and nipple: Secondary | ICD-10-CM

## 2023-06-06 MED ORDER — SULFAMETHOXAZOLE-TRIMETHOPRIM 800-160 MG PO TABS
1.0000 | ORAL_TABLET | Freq: Two times a day (BID) | ORAL | 0 refills | Status: AC
Start: 2023-06-06 — End: 2023-06-16

## 2023-06-06 NOTE — Progress Notes (Signed)
Date:  06/06/2023   Name:  Diane Vasquez   DOB:  Nov 30, 1955   MRN:  [id number]   Chief Complaint: Recurrent Skin Infections (Boil is located under left breast. Red, and sensitive to touch. Swelling has gone down. Past has hx of having boils.)  HPI Lesion came up under left breast about a week ago.  It was small then got much larger but never had a head or drained.  The redness is now improving some and it is less tender.  She is going to the beach and wants it checked.  Lab Results  Component Value Date   NA 143 05/01/2023   K 4.7 05/01/2023   CO2 24 05/01/2023   GLUCOSE 99 05/01/2023   BUN 11 05/01/2023   CREATININE 0.88 05/01/2023   CALCIUM 9.3 05/01/2023   EGFR 73 05/01/2023   GFRNONAA 76 04/09/2020   Lab Results  Component Value Date   CHOL 210 (H) 05/01/2023   HDL 44 05/01/2023   LDLCALC 139 (H) 05/01/2023   TRIG 149 05/01/2023   CHOLHDL 4.8 (H) 05/01/2023   Lab Results  Component Value Date   TSH 2.530 05/01/2023   Lab Results  Component Value Date   HGBA1C 5.2 04/22/2022   Lab Results  Component Value Date   WBC 4.8 05/01/2023   HGB 14.8 05/01/2023   HCT 45.2 05/01/2023   MCV 93 05/01/2023   PLT 209 05/01/2023   Lab Results  Component Value Date   ALT 19 05/01/2023   AST 19 05/01/2023   ALKPHOS 88 05/01/2023   BILITOT 0.4 05/01/2023   Lab Results  Component Value Date   VD25OH 39.7 05/01/2023     Review of Systems  Constitutional:  Negative for fatigue and fever.  Respiratory:  Negative for chest tightness and shortness of breath.   Cardiovascular:  Negative for chest pain.  Skin:  Positive for color change.  Psychiatric/Behavioral:  Negative for dysphoric mood and sleep disturbance. The patient is not nervous/anxious.     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 No cervix or uterus.   BREAST CYST ASPIRATION Right    neg   COLONOSCOPY  11/02/2009   normal - repeat 10 yrs   CTR     PARATHYROIDECTOMY Right 09/19/2016   Procedure: EXCISION PARATHYROID ADENOMA;  Surgeon: Vernie Murders, MD;  Location: [facility] ORS;  Service: ENT;  Laterality: Right;   SINUS SURGERY WITH INSTATRAK     sqamous cell removal Right 02/12/2019   Dr. Cheree Ditto. Surgery X 2    Social History   Tobacco Use   Smoking status: Never   Smokeless tobacco: Never  Vaping Use   Vaping  status: Never Used  Substance Use Topics   Alcohol use: Yes    Comment: occ   Drug use: No     Medication list has been reviewed and updated.  Current Meds  Medication Sig   Ascorbic Acid (VITAMIN C) 1000 MG tablet Take 1,000 mg by mouth daily.   cetirizine (ZYRTEC) 10 MG tablet Take 10 mg by mouth daily.   cimetidine (TAGAMET) 400 MG tablet Take 1 tablet (400 mg total) by mouth 2 (two) times daily.   clindamycin (CLEOCIN T) 1 % external solution Apply topically daily.   clobetasol (TEMOVATE) 0.05 % external solution clobetasol 0.05 % scalp solution  1(ONE) APPLICATION(S) SCALP EVERY DAY AS NEEDED FOR FLARES   dapsone 100 MG tablet Take 50 mg by mouth daily.    metroNIDAZOLE (METROGEL) 1 % gel Apply 1 application topically daily.    pseudoephedrine-guaifenesin (MUCINEX D) 60-600 MG 12  hr tablet Take 1 tablet by mouth every 12 (twelve) hours.   sulfamethoxazole-trimethoprim (BACTRIM DS) 800-160 MG tablet Take 1 tablet by mouth 2 (two) times daily for 10 days.   tretinoin (RETIN-A) 0.05 % cream Apply topically at bedtime.   Vitamin D, Ergocalciferol, (DRISDOL) 1.25 MG (50000 UNIT) CAPS capsule TAKE 1 CAPSULE BY MOUTH ONE TIME PER WEEK       06/06/2023    9:22 AM 05/01/2023    8:07 AM 10/28/2022    9:21 AM 04/22/2022    8:39 AM  GAD 7 : Generalized Anxiety Score  Nervous, Anxious, on Edge 0 0 0 0  Control/stop worrying 0 0 0 0  Worry too much - different things 0 0 0 0  Trouble relaxing 0 0 0 0  Restless 0 0 0 0  Easily annoyed or irritable 0 0 0 0  Afraid - awful might happen 0 0 0 0  Total GAD 7 Score 0 0 0 0  Anxiety Difficulty Not difficult at all Not difficult at all Not difficult at all Not difficult at all       06/06/2023    9:22 AM 05/01/2023    8:07 AM 10/28/2022    9:21 AM  Depression screen PHQ 2/9  Decreased Interest 0 0 0  Down, Depressed, Hopeless 0 0 0  PHQ - 2 Score 0 0 0  Altered sleeping 0 0 0  Tired, decreased energy 0 0 0  Change in appetite 0 0 0  Feeling bad or failure about yourself  0 0 0  Trouble concentrating 0 0 0  Moving slowly or fidgety/restless 0 0 0  Suicidal thoughts 0 0 0  PHQ-9 Score 0 0 0  Difficult doing work/chores Not difficult at all Not difficult at all Not difficult at all    BP Readings from Last 3 Encounters:  06/06/23 104/60  05/01/23 122/64  10/28/22 124/70    Physical Exam Vitals and nursing note reviewed.  Constitutional:      General: She is not in acute distress.    Appearance: She is well-developed.  HENT:     Head: Normocephalic and atraumatic.  Pulmonary:     Effort: Pulmonary effort is normal. No respiratory distress.  Chest:       Comments: 3 cm red, slightly tender, not indurated area with small central raised papule.  No bleeding or drainage. Skin:    General: Skin is warm and dry.      Findings: No rash.  Neurological:     Mental Status: She is alert and oriented to person, place,  and time.  Psychiatric:        Mood and Affect: Mood normal.        Behavior: Behavior normal.     Wt Readings from Last 3 Encounters:  06/06/23 229 lb (103.9 kg)  05/01/23 229 lb (103.9 kg)  10/28/22 221 lb (100.2 kg)    BP 104/60   Pulse 82   Ht 5\' 4"  (1.626 m)   Wt 229 lb (103.9 kg)   SpO2 97%   BMI 39.31 kg/m   Assessment and Plan:  Problem List Items Addressed This Visit   None Visit Diagnoses     Breast abscess of female    -  Primary   continue local care; topical clindamycin take Bactrim bid follow up if worsening   Relevant Medications   sulfamethoxazole-trimethoprim (BACTRIM DS) 800-160 MG tablet       No follow-ups on file.    Reubin Milan, MD [facility] Primary Care and Sports Medicine [place]

## 2023-06-30 ENCOUNTER — Other Ambulatory Visit: Payer: Self-pay | Admitting: Internal Medicine

## 2023-06-30 DIAGNOSIS — K219 Gastro-esophageal reflux disease without esophagitis: Secondary | ICD-10-CM

## 2023-07-03 NOTE — Telephone Encounter (Signed)
Requested Prescriptions  Pending Prescriptions Disp Refills   cimetidine (TAGAMET) 400 MG tablet [Pharmacy Med Name: CIMETIDINE 400 MG TABLET] 180 tablet 0    Sig: TAKE 1 TABLET BY MOUTH TWICE A DAY     Gastroenterology:  H2 Antagonists - cimetidine Passed - 06/30/2023  2:11 PM      Passed - Cr in normal range and within 360 days    Creatinine, Ser  Date Value Ref Range Status  05/01/2023 0.88 0.57 - 1.00 mg/dL Final         Passed - Valid encounter within last 12 months    Recent Outpatient Visits           3 weeks ago Breast abscess of female   [facility] Medicine at [facility], Nyoka Cowden, MD   2 months ago Annual physical exam   [facility] Medicine at [facility], Nyoka Cowden, MD   8 months ago Muscle spasm   [facility] Medicine at [facility], Nyoka Cowden, MD   10 months ago Acute non-recurrent maxillary sinusitis   [place] [facility] Medicine at [facility], Nyoka Cowden, MD   1 year ago Annual physical exam   [facility] Primary Care & Sports Medicine at [facility], Nyoka Cowden, MD       Future Appointments             In 10 months Judithann Graves Nyoka Cowden, MD [facility] Primary Care & Sports Medicine at [facility], [facility]

## 2023-09-29 ENCOUNTER — Other Ambulatory Visit: Payer: Self-pay | Admitting: Internal Medicine

## 2023-09-29 DIAGNOSIS — K219 Gastro-esophageal reflux disease without esophagitis: Secondary | ICD-10-CM

## 2023-09-29 NOTE — Telephone Encounter (Signed)
Requested Prescriptions  Pending Prescriptions Disp Refills   cimetidine (TAGAMET) 400 MG tablet [Pharmacy Med Name: CIMETIDINE 400 MG TABLET] 180 tablet 0    Sig: TAKE 1 TABLET BY MOUTH TWICE A DAY     Gastroenterology:  H2 Antagonists - cimetidine Passed - 09/29/2023  1:40 AM      Passed - Cr in normal range and within 360 days    Creatinine, Ser  Date Value Ref Range Status  05/01/2023 0.88 0.57 - 1.00 mg/dL Final         Passed - Valid encounter within last 12 months    Recent Outpatient Visits           3 months ago Breast abscess of female   [facility] Medicine at [facility], Nyoka Cowden, MD   5 months ago Annual physical exam   [facility] Primary Care & Sports Medicine at [facility], Nyoka Cowden, MD   11 months ago Muscle spasm   [place] [facility] at [facility], Nyoka Cowden, MD   1 year ago Acute non-recurrent maxillary sinusitis   [place] [facility] Medicine at [facility], Nyoka Cowden, MD   1 year ago Annual physical exam   [facility] & [facility] Medicine at [facility], Nyoka Cowden, MD       Future Appointments             In 7 months Judithann Graves, Nyoka Cowden, MD [facility] Primary Care & Sports Medicine at [facility], [facility]

## 2024-03-19 DIAGNOSIS — L570 Actinic keratosis: Secondary | ICD-10-CM | POA: Diagnosis not present

## 2024-03-21 ENCOUNTER — Other Ambulatory Visit: Payer: Self-pay | Admitting: Internal Medicine

## 2024-03-21 DIAGNOSIS — K219 Gastro-esophageal reflux disease without esophagitis: Secondary | ICD-10-CM

## 2024-03-21 NOTE — Telephone Encounter (Signed)
 Copied from CRM [id number]. Topic: Clinical - Medication Refill >> Mar 21, 2024  8:49 AM Ivette P wrote: Medication: cimetidine  (TAGAMET ) 400 MG tablet  Has the patient contacted their pharmacy? No (Agent: If no, request that the patient contact the pharmacy for the refill. If patient does not wish to contact the pharmacy document the reason why and proceed with request.) (Agent: If yes, when and what did the pharmacy advise?)  This is the patient's preferred pharmacy:  [facility] [phone]  [address] [place], [place] [address] Phone: [phone]     Is this the correct pharmacy for this prescription? No If no, delete pharmacy and type the correct one.   Has the prescription been filled recently? No, last fill 09/29/2023  Is the patient out of the medication? Yes, enough for one more week   Has the patient been seen for an appointment in the last year OR does the patient have an upcoming appointment? Yes, 05/02/2024  Can we respond through MyChart? Yes  Agent: Please be advised that Rx refills may take up to 3 business days. We ask that you follow-up with your pharmacy.

## 2024-03-22 MED ORDER — CIMETIDINE 400 MG PO TABS
400.0000 mg | ORAL_TABLET | Freq: Two times a day (BID) | ORAL | 0 refills | Status: DC
Start: 2024-03-22 — End: 2024-04-02

## 2024-03-22 NOTE — Telephone Encounter (Signed)
 Requested Prescriptions  Pending Prescriptions Disp Refills   cimetidine  (TAGAMET ) 400 MG tablet 180 tablet 0    Sig: Take 1 tablet (400 mg total) by mouth 2 (two) times daily.     Gastroenterology:  H2 Antagonists - cimetidine  Failed - 03/22/2024  5:31 PM      Failed - Valid encounter within last 12 months    Recent Outpatient Visits   None     Future Appointments             In 1 month Gala Jubilee, [place], MD [facility] Primary Care & Sports Medicine at [facility], [facility]            Passed - Cr in normal range and within 360 days    Creatinine, Ser  Date Value Ref Range Status  05/01/2023 0.88 0.57 - 1.00 mg/dL Final

## 2024-04-01 ENCOUNTER — Telehealth: Payer: Self-pay

## 2024-04-01 NOTE — Telephone Encounter (Signed)
 Copied from CRM [id number]. Topic: Clinical - Prescription Issue >> Apr 01, 2024  1:10 PM Talmadge Fail S wrote: Reason for CRM: Crystal from [facility] calling in regards to lowering the copay for the medication of cimetidine  (TAGAMET ) 400 MG tablet. Fax will be sent over to the provider to fill out.

## 2024-04-02 ENCOUNTER — Other Ambulatory Visit: Payer: Self-pay | Admitting: Internal Medicine

## 2024-04-02 ENCOUNTER — Telehealth: Payer: Self-pay

## 2024-04-02 DIAGNOSIS — K219 Gastro-esophageal reflux disease without esophagitis: Secondary | ICD-10-CM

## 2024-04-02 MED ORDER — FAMOTIDINE 20 MG PO TABS
20.0000 mg | ORAL_TABLET | Freq: Two times a day (BID) | ORAL | 0 refills | Status: DC
Start: 2024-04-02 — End: 2024-04-03

## 2024-04-02 NOTE — Telephone Encounter (Signed)
 Called and spoke with pt, she is okay with trying Pepcid and would like a months supply.

## 2024-04-02 NOTE — Telephone Encounter (Signed)
 Copied from CRM [id number]. Topic: Clinical - Medication Question >> Apr 02, 2024  8:54 AM Marissa P wrote: Reason for CRM: Patient called wanted us  to have correct pharmacy for where her meds needs to be sent to please: [id number] [place] - [address] AT [facility] OF [address] [place] [address]NATFT: [phone] Fax: [phone]: Not open 24 hours   Patient pharmacy has been updated in her Chart  - CM

## 2024-04-03 ENCOUNTER — Other Ambulatory Visit: Payer: Self-pay | Admitting: Internal Medicine

## 2024-04-03 ENCOUNTER — Telehealth: Payer: Self-pay

## 2024-04-03 DIAGNOSIS — K219 Gastro-esophageal reflux disease without esophagitis: Secondary | ICD-10-CM

## 2024-04-03 MED ORDER — FAMOTIDINE 20 MG PO TABS: 20.0000 mg | ORAL_TABLET | Freq: Two times a day (BID) | ORAL | 0 refills | Status: DC

## 2024-04-03 NOTE — Telephone Encounter (Signed)
 Copied from CRM [id number]. Topic: Clinical - Medication Question >> Apr 03, 2024  9:33 AM Rosamond Comes wrote: Patient calling, stating the medication has not been sent to [facility].  Patient would like a call back regarding what cheaper medication will be called in.  Patient phone [phone]

## 2024-04-03 NOTE — Telephone Encounter (Signed)
 Pharmacy is changed it pts chart.  KP Copied from CRM [id number]. Topic: Clinical - Medication Question >> Apr 02, 2024  8:54 AM Marissa P wrote: Reason for CRM: Patient called wanted us  to have correct pharmacy for where her meds needs to be sent to please: [facility] #[id number] [facility] [place] - [address] AT [facility] OF [address] [place] [id number]: [phone] Fax: [phone]: Not open 24 hours >> Apr 03, 2024  9:33 AM Rosamond Comes wrote: Patient calling, stating the medication has not been sent to [facility].  Patient would like a call back regarding what cheaper medication will be called in.  Patient phone [phone]

## 2024-04-08 DIAGNOSIS — K080 Exfoliation of teeth due to systemic causes: Secondary | ICD-10-CM | POA: Diagnosis not present

## 2024-04-30 ENCOUNTER — Encounter: Payer: Self-pay | Admitting: Internal Medicine

## 2024-04-30 DIAGNOSIS — H2513 Age-related nuclear cataract, bilateral: Secondary | ICD-10-CM | POA: Diagnosis not present

## 2024-05-02 ENCOUNTER — Encounter: Payer: Self-pay | Admitting: Internal Medicine

## 2024-05-02 ENCOUNTER — Ambulatory Visit (INDEPENDENT_AMBULATORY_CARE_PROVIDER_SITE_OTHER): Payer: Self-pay | Admitting: Internal Medicine

## 2024-05-02 VITALS — BP 128/70 | HR 70 | Ht 64.0 in | Wt 228.0 lb

## 2024-05-02 DIAGNOSIS — Z1231 Encounter for screening mammogram for malignant neoplasm of breast: Secondary | ICD-10-CM

## 2024-05-02 DIAGNOSIS — Z0000 Encounter for general adult medical examination without abnormal findings: Secondary | ICD-10-CM

## 2024-05-02 DIAGNOSIS — E66812 Obesity, class 2: Secondary | ICD-10-CM | POA: Insufficient documentation

## 2024-05-02 DIAGNOSIS — K219 Gastro-esophageal reflux disease without esophagitis: Secondary | ICD-10-CM | POA: Diagnosis not present

## 2024-05-02 DIAGNOSIS — Z13820 Encounter for screening for osteoporosis: Secondary | ICD-10-CM | POA: Diagnosis not present

## 2024-05-02 DIAGNOSIS — E7800 Pure hypercholesterolemia, unspecified: Secondary | ICD-10-CM | POA: Diagnosis not present

## 2024-05-02 DIAGNOSIS — Q828 Other specified congenital malformations of skin: Secondary | ICD-10-CM

## 2024-05-02 DIAGNOSIS — R0902 Hypoxemia: Secondary | ICD-10-CM | POA: Diagnosis not present

## 2024-05-02 DIAGNOSIS — Z6839 Body mass index (BMI) 39.0-39.9, adult: Secondary | ICD-10-CM

## 2024-05-02 MED ORDER — FAMOTIDINE 20 MG PO TABS: 20.0000 mg | ORAL_TABLET | Freq: Two times a day (BID) | ORAL | 1 refills | Status: DC

## 2024-05-02 NOTE — Assessment & Plan Note (Signed)
 Reflux symptoms are controlled on Pepcid  but she felt better on Tagament.  Cost of tagament is too high - explore otc cost. Patient denies red flag symptoms - no melena, weight loss, dysphagia.

## 2024-05-02 NOTE — Patient Instructions (Signed)
 Call [facility] to schedule your mammogram and Bone Density at [phone].

## 2024-05-02 NOTE — Assessment & Plan Note (Signed)
 Managed with diet and apple cider vinegar daily. Lab Results  Component Value Date   LDLCALC 139 (H) 05/01/2023

## 2024-05-02 NOTE — Assessment & Plan Note (Signed)
 Recent flare up so taking dapsone 150 mg daily. No shortness of breath but low O2 sat today - she had similar issues in the past worked up by Pulmonary and resolved once Dapsone was tapered off. Will have her monitor sats at home -keeping it > 90% Check methemoglobin levels

## 2024-05-02 NOTE — Progress Notes (Signed)
 Date:  05/02/2024   Name:  Diane Vasquez   DOB:  08-09-1956   MRN:  [id number]   Chief Complaint: Annual Exam Diane Vasquez is a 68 y.o. female who presents today for her Complete Annual Exam. She feels well. She reports exercising- gardening. She reports she is sleeping well. Breast complaints - none.  Health Maintenance  Topic Date Due   Medicare Annual Wellness Visit  Never done   COVID-19 Vaccine (1) Never done   Zoster (Shingles) Vaccine (1 of 2) Never done   Pneumococcal Vaccine for age over 29 (1 of 1 - PCV) Never done   DTaP/Tdap/Td vaccine (2 - Td or Tdap) 10/23/2023   Flu Shot  05/24/2024   Mammogram  05/29/2024   Colon Cancer Screening  06/05/2030   DEXA scan (bone density measurement)  Completed   Hepatitis C Screening  Completed   HIV Screening  Completed   Hepatitis B Vaccine  Aged Out   HPV Vaccine  Aged Out   Meningitis B Vaccine  Aged Out   Hailey-Hailey disease - now having a flare up and taking more Dapsone recently due to worsening.  On 150 mg daily for several weeks.  She has also had shortness of breath in the past on higher dose Dapsone.   Gastroesophageal Reflux She complains of heartburn. She reports no abdominal pain, no chest pain, no coughing or no wheezing. This is a recurrent problem. The problem occurs occasionally. Pertinent negatives include no fatigue. She has tried a histamine-2 antagonist for the symptoms. The treatment provided significant relief.    Review of Systems  Constitutional:  Negative for fatigue and unexpected weight change.  HENT:  Negative for trouble swallowing.   Eyes:  Negative for visual disturbance.  Respiratory:  Negative for cough, chest tightness, shortness of breath and wheezing.   Cardiovascular:  Negative for chest pain, palpitations and leg swelling.  Gastrointestinal:  Positive for heartburn. Negative for abdominal pain, constipation and diarrhea.  Musculoskeletal:  Negative for arthralgias and myalgias.   Neurological:  Negative for dizziness, weakness, light-headedness and headaches.     Lab Results  Component Value Date   NA 143 05/01/2023   K 4.7 05/01/2023   CO2 24 05/01/2023   GLUCOSE 99 05/01/2023   BUN 11 05/01/2023   CREATININE 0.88 05/01/2023   CALCIUM 9.3 05/01/2023   EGFR 73 05/01/2023   GFRNONAA 76 04/09/2020   Lab Results  Component Value Date   CHOL 210 (H) 05/01/2023   HDL 44 05/01/2023   LDLCALC 139 (H) 05/01/2023   TRIG 149 05/01/2023   CHOLHDL 4.8 (H) 05/01/2023   Lab Results  Component Value Date   TSH 2.530 05/01/2023   Lab Results  Component Value Date   HGBA1C 5.2 04/22/2022   Lab Results  Component Value Date   WBC 4.8 05/01/2023   HGB 14.8 05/01/2023   HCT 45.2 05/01/2023   MCV 93 05/01/2023   PLT 209 05/01/2023   Lab Results  Component Value Date   ALT 19 05/01/2023   AST 19 05/01/2023   ALKPHOS 88 05/01/2023   BILITOT 0.4 05/01/2023   Lab Results  Component Value Date   VD25OH 39.7 05/01/2023     Patient Active Problem List   Diagnosis Date Noted   Class 2 obesity without serious comorbidity with body mass index (BMI) of 39.0 to 39.9 in adult 05/02/2024   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/01/2023   Status post hysterectomy 12/02/2020   Squamous cell  skin cancer 06/09/2020   Vitamin D  deficiency 04/03/2018   Hx of hematuria 10/01/2017   Obesity (BMI 30-39.9) 08/04/2017   Parathyroid  adenoma 09/19/2016   History of multiple pulmonary nodules 03/17/2016   Chronic fatigue, unspecified 02/16/2016   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 No  cervix or uterus.   BREAST CYST ASPIRATION Right    neg   COLONOSCOPY  11/02/2009   normal - repeat 10 yrs   CTR     PARATHYROIDECTOMY Right 09/19/2016   Procedure: EXCISION PARATHYROID  ADENOMA;  Surgeon: Deward Argue, MD;  Location: [facility] ORS;  Service: ENT;  Laterality: Right;   SINUS SURGERY WITH INSTATRAK     sqamous cell removal Right 02/12/2019   Dr. Arlyss. Surgery X 2    Social History   Tobacco Use   Smoking status: Never   Smokeless tobacco: Never  Vaping Use   Vaping status: Never Used  Substance Use Topics   Alcohol use: Yes    Comment: occ   Drug use: No     Medication list has been reviewed and updated.  Current Meds  Medication Sig   Ascorbic Acid (VITAMIN C) 1000 MG tablet Take 1,000 mg by mouth daily.   cetirizine (ZYRTEC) 10 MG tablet Take 10 mg by mouth daily.   clindamycin (CLEOCIN T) 1 % external solution Apply topically daily.   clobetasol (TEMOVATE) 0.05 % external solution clobetasol 0.05 % scalp solution  1(ONE) APPLICATION(S) SCALP EVERY DAY AS NEEDED FOR FLARES   dapsone 100 MG tablet Take 50 mg by mouth daily.    metroNIDAZOLE (METROGEL) 1 % gel Apply 1 application topically daily.    pseudoephedrine-guaifenesin (MUCINEX D) 60-600 MG 12 hr tablet Take 1 tablet by mouth every 12 (twelve) hours.   tretinoin (RETIN-A) 0.05 % cream Apply topically at bedtime.   Vitamin D , Ergocalciferol , (DRISDOL) 1.25 MG (50000 UNIT) CAPS capsule TAKE 1 CAPSULE BY MOUTH ONE TIME PER WEEK   [DISCONTINUED] famotidine  (PEPCID ) 20 MG tablet Take 1 tablet (20 mg total) by mouth 2 (two) times daily.       05/02/2024    8:09 AM 06/06/2023    9:22 AM 05/01/2023    8:07 AM 10/28/2022    9:21 AM  GAD 7 : Generalized Anxiety Score  Nervous, Anxious, on Edge 0 0 0 0  Control/stop worrying 0 0 0 0  Worry too much - different things 0 0 0 0  Trouble relaxing 0 0 0 0  Restless 0 0 0 0  Easily annoyed or irritable 0 0 0 0  Afraid - awful might happen 0 0 0 0  Total GAD 7  Score 0 0 0 0  Anxiety Difficulty Not difficult at all Not difficult at all Not difficult at all Not difficult at all       05/02/2024    8:09 AM 06/06/2023    9:22 AM 05/01/2023    8:07 AM  Depression screen PHQ 2/9  Decreased Interest 0 0 0  Down, Depressed, Hopeless 0 0 0  PHQ - 2 Score 0 0  0  Altered sleeping 0 0 0  Tired, decreased energy 0 0 0  Change in appetite 0 0 0  Feeling bad or failure about yourself  0 0 0  Trouble concentrating 0 0 0  Moving slowly or fidgety/restless 0 0 0  Suicidal thoughts 0 0 0  PHQ-9 Score 0 0 0  Difficult doing work/chores Not difficult at all Not difficult at all Not difficult at all    BP Readings from Last 3 Encounters:  05/02/24 128/70  06/06/23 104/60  05/01/23 122/64    Physical Exam Vitals and nursing note reviewed.  Constitutional:      General: She is not in acute distress.    Appearance: She is well-developed.  HENT:     Head: Normocephalic and atraumatic.     Right Ear: Tympanic membrane and ear canal normal.     Left Ear: Tympanic membrane and ear canal normal.     Nose:     Right Sinus: No maxillary sinus tenderness.     Left Sinus: No maxillary sinus tenderness.  Eyes:     General: No scleral icterus.       Right eye: No discharge.        Left eye: No discharge.     Conjunctiva/sclera: Conjunctivae normal.  Neck:     Thyroid : No thyromegaly.     Vascular: No carotid bruit.  Cardiovascular:     Rate and Rhythm: Normal rate and regular rhythm.     Pulses: Normal pulses.     Heart sounds: Normal heart sounds.  Pulmonary:     Effort: Pulmonary effort is normal. No respiratory distress.     Breath sounds: No wheezing.  Abdominal:     General: Bowel sounds are normal.     Palpations: Abdomen is soft.     Tenderness: There is no abdominal tenderness.  Musculoskeletal:     Cervical back: Normal range of motion. No erythema.     Right lower leg: No edema.     Left lower leg: No edema.  Lymphadenopathy:      Cervical: No cervical adenopathy.  Skin:    General: Skin is warm and dry.     Findings: Erythema, lesion and rash present.     Comments: Under both breasts  Neurological:     Mental Status: She is alert and oriented to person, place, and time.     Cranial Nerves: No cranial nerve deficit.     Sensory: No sensory deficit.     Deep Tendon Reflexes: Reflexes are normal and symmetric.  Psychiatric:        Attention and Perception: Attention normal.        Mood and Affect: Mood normal.     Wt Readings from Last 3 Encounters:  05/02/24 228 lb (103.4 kg)  06/06/23 229 lb (103.9 kg)  05/01/23 229 lb (103.9 kg)    BP 128/70   Pulse 70   Ht 5' 4 (1.626 m)   Wt 228 lb (103.4 kg)   SpO2 90%   BMI 39.14 kg/m   Assessment and Plan:  Problem List Items Addressed This Visit       Unprioritized   GERD (gastroesophageal reflux disease) (Chronic)   Reflux symptoms are controlled on Pepcid  but she felt better on Tagament.  Cost of tagament is too high - explore otc cost. Patient denies red flag symptoms - no melena, weight loss, dysphagia.       Relevant Medications   famotidine  (PEPCID ) 20 MG  tablet   Other Relevant Orders   CBC with Differential/Platelet   Hypercholesterolemia (Chronic)   Managed with diet and apple cider vinegar daily. Lab Results  Component Value Date   LDLCALC 139 (H) 05/01/2023         Relevant Orders   Lipid panel   TSH   Hailey-hailey disease   Recent flare up so taking dapsone 150 mg daily. No shortness of breath but low O2 sat today - she had similar issues in the past worked up by Pulmonary and resolved once Dapsone was tapered off. Will have her monitor sats at home -keeping it > 90% Check methemoglobin levels       Relevant Orders   Comprehensive metabolic panel with GFR   Class 2 obesity without serious comorbidity with body mass index (BMI) of 39.0 to 39.9 in adult   Other Visit Diagnoses       Annual physical exam    -  Primary      Encounter for screening mammogram for breast cancer       Relevant Orders   MM 3D SCREENING MAMMOGRAM BILATERAL BREAST     Encounter for screening for osteoporosis       Relevant Orders   DG Bone Density     Hypoxemia       may be a side effect of Dapsone will check methemaglobin level   Relevant Orders   Methemoglobin, Blood       No follow-ups on file.    Leita HILARIO Adie, MD [facility] [place]

## 2024-05-03 ENCOUNTER — Other Ambulatory Visit: Payer: Self-pay | Admitting: Internal Medicine

## 2024-05-03 DIAGNOSIS — K219 Gastro-esophageal reflux disease without esophagitis: Secondary | ICD-10-CM | POA: Diagnosis not present

## 2024-05-03 DIAGNOSIS — R0902 Hypoxemia: Secondary | ICD-10-CM | POA: Diagnosis not present

## 2024-05-03 DIAGNOSIS — E7800 Pure hypercholesterolemia, unspecified: Secondary | ICD-10-CM | POA: Diagnosis not present

## 2024-05-03 DIAGNOSIS — Q828 Other specified congenital malformations of skin: Secondary | ICD-10-CM | POA: Diagnosis not present

## 2024-05-04 LAB — CBC WITH DIFFERENTIAL/PLATELET
Basophils Absolute: 0.1 x10E3/uL (ref 0.0–0.2)
Basos: 1 %
EOS (ABSOLUTE): 0.1 x10E3/uL (ref 0.0–0.4)
Eos: 2 %
Hematocrit: 41 % (ref 34.0–46.6)
Hemoglobin: 12.9 g/dL (ref 11.1–15.9)
Immature Grans (Abs): 0 x10E3/uL (ref 0.0–0.1)
Immature Granulocytes: 0 %
Lymphocytes Absolute: 1.8 x10E3/uL (ref 0.7–3.1)
Lymphs: 39 %
MCH: 31.2 pg (ref 26.6–33.0)
MCHC: 31.5 g/dL (ref 31.5–35.7)
MCV: 99 fL — ABNORMAL HIGH (ref 79–97)
Monocytes Absolute: 0.4 x10E3/uL (ref 0.1–0.9)
Monocytes: 9 %
Neutrophils Absolute: 2.2 x10E3/uL (ref 1.4–7.0)
Neutrophils: 48 %
Platelets: 260 x10E3/uL (ref 150–450)
RBC: 4.14 x10E6/uL (ref 3.77–5.28)
RDW: 14.7 % (ref 11.7–15.4)
WBC: 4.6 x10E3/uL (ref 3.4–10.8)

## 2024-05-04 LAB — COMPREHENSIVE METABOLIC PANEL WITH GFR
ALT: 21 IU/L (ref 0–32)
AST: 25 IU/L (ref 0–40)
Albumin: 4.3 g/dL (ref 3.9–4.9)
Alkaline Phosphatase: 90 IU/L (ref 44–121)
BUN/Creatinine Ratio: 21 (ref 12–28)
BUN: 14 mg/dL (ref 8–27)
Bilirubin Total: 0.9 mg/dL (ref 0.0–1.2)
CO2: 21 mmol/L (ref 20–29)
Calcium: 9.2 mg/dL (ref 8.7–10.3)
Chloride: 105 mmol/L (ref 96–106)
Creatinine, Ser: 0.68 mg/dL (ref 0.57–1.00)
Globulin, Total: 1.9 g/dL (ref 1.5–4.5)
Glucose: 97 mg/dL (ref 70–99)
Potassium: 4.7 mmol/L (ref 3.5–5.2)
Sodium: 140 mmol/L (ref 134–144)
Total Protein: 6.2 g/dL (ref 6.0–8.5)
eGFR: 97 mL/min/1.73 (ref >59)

## 2024-05-04 LAB — LIPID PANEL
Chol/HDL Ratio: 5 ratio — ABNORMAL HIGH (ref 0.0–4.4)
Cholesterol, Total: 185 mg/dL (ref 100–199)
HDL: 37 mg/dL — ABNORMAL LOW (ref >39)
LDL Chol Calc (NIH): 129 mg/dL — ABNORMAL HIGH (ref 0–99)
Triglycerides: 104 mg/dL (ref 0–149)
VLDL Cholesterol Cal: 19 mg/dL (ref 5–40)

## 2024-05-04 LAB — TSH: TSH: 2.01 u[IU]/mL (ref 0.450–4.500)

## 2024-05-04 NOTE — Telephone Encounter (Signed)
 Requested medication (s) are due for refill today: yes  Requested medication (s) are on the active medication list: yes  Last refill:  05/22/23 #12 3 RF  Future visit scheduled: no  Notes to clinic:  medication not delegated to NT to RF   Requested Prescriptions  Pending Prescriptions Disp Refills   Vitamin D , Ergocalciferol , (DRISDOL) 1.25 MG (50000 UNIT) CAPS capsule [Pharmacy Med Name: VITAMIN D2 1.25MG (50,000 UNIT)] 12 capsule 3    Sig: TAKE 1 CAPSULE BY MOUTH ONE TIME PER WEEK     Endocrinology:  Vitamins - Vitamin D  Supplementation 2 Failed - 05/04/2024  2:06 PM      Failed - Manual Review: Route requests for 50,000 IU strength to the provider      Failed - Vitamin D  in normal range and within 360 days    Vit D, 25-Hydroxy  Date Value Ref Range Status  05/01/2023 39.7 30.0 - 100.0 ng/mL Final    Comment:    Vitamin D  deficiency has been defined by the Institute of Medicine and an Endocrine Society practice guideline as a level of serum 25-OH vitamin D  less than 20 ng/mL (1,2). The Endocrine Society went on to further define vitamin D  insufficiency as a level between 21 and 29 ng/mL (2). 1. IOM (Institute of Medicine). 2010. Dietary reference    intakes for calcium and D. [place]: The    [employer]. 2. Holick MF, Binkley , Bischoff-Ferrari HA, et al.    Evaluation, treatment, and prevention of vitamin D     deficiency: an Endocrine Society clinical practice    guideline. JCEM. 2011 Jul; 96(7):1911-30.          Passed - Ca in normal range and within 360 days    Calcium  Date Value Ref Range Status  05/03/2024 9.2 8.7 - 10.3 mg/dL Final         Passed - Valid encounter within last 12 months    Recent Outpatient Visits           2 days ago Annual physical exam   [facility] at [facility], Leita DEL, MD

## 2024-05-05 ENCOUNTER — Ambulatory Visit: Payer: Self-pay | Admitting: Internal Medicine

## 2024-05-06 ENCOUNTER — Telehealth: Payer: Self-pay

## 2024-05-06 NOTE — Telephone Encounter (Signed)
 Copied from CRM [id number]. Topic: Clinical - Request for Lab/Test Order >> May 06, 2024 10:34 AM Diane Vasquez wrote: Reason for CRM: Pt called regarding her lab for Methemoglobin, Blood (Order [id number])  Pt wants to know the results, however it is not showing that it has been collected. She says the nurse had a special vile for this sample. Please advise

## 2024-05-07 NOTE — Telephone Encounter (Signed)
 Called [facility]- the lab is pending. Called and informed pt this and let her know once it returns we will let her know.

## 2024-05-08 LAB — METHEMOGLOBIN, BLOOD: Methemoglobin, Blood: 1.5 % (ref 0.4–1.5)

## 2024-05-08 NOTE — Telephone Encounter (Signed)
 Please review and advise.   JM

## 2024-05-23 DIAGNOSIS — L700 Acne vulgaris: Secondary | ICD-10-CM | POA: Diagnosis not present

## 2024-05-23 DIAGNOSIS — L218 Other seborrheic dermatitis: Secondary | ICD-10-CM | POA: Diagnosis not present

## 2024-05-23 DIAGNOSIS — Q828 Other specified congenital malformations of skin: Secondary | ICD-10-CM | POA: Diagnosis not present

## 2024-05-23 DIAGNOSIS — L2089 Other atopic dermatitis: Secondary | ICD-10-CM | POA: Diagnosis not present

## 2024-05-23 DIAGNOSIS — B372 Candidiasis of skin and nail: Secondary | ICD-10-CM | POA: Diagnosis not present

## 2024-06-18 ENCOUNTER — Ambulatory Visit
Admission: RE | Admit: 2024-06-18 | Discharge: 2024-06-18 | Disposition: A | Discharge status: Home / Self Care | Source: Ambulatory Visit | Attending: Internal Medicine | Admitting: Internal Medicine

## 2024-06-18 DIAGNOSIS — Z13820 Encounter for screening for osteoporosis: Secondary | ICD-10-CM | POA: Diagnosis not present

## 2024-06-18 DIAGNOSIS — Z780 Asymptomatic menopausal state: Secondary | ICD-10-CM | POA: Insufficient documentation

## 2024-06-18 DIAGNOSIS — Z1231 Encounter for screening mammogram for malignant neoplasm of breast: Secondary | ICD-10-CM | POA: Insufficient documentation

## 2024-06-18 DIAGNOSIS — E2839 Other primary ovarian failure: Secondary | ICD-10-CM | POA: Diagnosis not present

## 2024-08-01 DIAGNOSIS — Z85820 Personal history of malignant melanoma of skin: Secondary | ICD-10-CM | POA: Diagnosis not present

## 2024-08-01 DIAGNOSIS — Z86018 Personal history of other benign neoplasm: Secondary | ICD-10-CM | POA: Diagnosis not present

## 2024-08-01 DIAGNOSIS — Z859 Personal history of malignant neoplasm, unspecified: Secondary | ICD-10-CM | POA: Diagnosis not present

## 2024-08-01 DIAGNOSIS — L578 Other skin changes due to chronic exposure to nonionizing radiation: Secondary | ICD-10-CM | POA: Diagnosis not present

## 2024-08-12 ENCOUNTER — Other Ambulatory Visit: Payer: Self-pay | Admitting: Internal Medicine

## 2024-08-12 DIAGNOSIS — K219 Gastro-esophageal reflux disease without esophagitis: Secondary | ICD-10-CM

## 2024-08-14 NOTE — Telephone Encounter (Signed)
 Requested Prescriptions  Pending Prescriptions Disp Refills   famotidine  (PEPCID ) 20 MG tablet [Pharmacy Med Name: FAMOTIDINE  20MG  TABLETS] 180 tablet 0    Sig: TAKE 1 TABLET(20 MG) BY MOUTH TWICE DAILY     Gastroenterology:  H2 Antagonists Passed - 08/14/2024  3:33 PM      Passed - Valid encounter within last 12 months    Recent Outpatient Visits           3 months ago Annual physical exam   [facility] at [facility], Leita DEL, MD

## 2024-09-11 ENCOUNTER — Encounter: Payer: Self-pay | Admitting: Internal Medicine

## 2024-09-11 ENCOUNTER — Ambulatory Visit: Payer: Self-pay

## 2024-09-11 ENCOUNTER — Ambulatory Visit (INDEPENDENT_AMBULATORY_CARE_PROVIDER_SITE_OTHER): Admitting: Internal Medicine

## 2024-09-11 VITALS — BP 122/74 | HR 87 | Temp 98.2°F | Ht 64.0 in | Wt 221.0 lb

## 2024-09-11 DIAGNOSIS — J0100 Acute maxillary sinusitis, unspecified: Secondary | ICD-10-CM | POA: Diagnosis not present

## 2024-09-11 DIAGNOSIS — R058 Other specified cough: Secondary | ICD-10-CM | POA: Diagnosis not present

## 2024-09-11 LAB — POC COVID19/FLU A&B COMBO
Covid Antigen, POC: NEGATIVE
Influenza A Antigen, POC: NEGATIVE
Influenza B Antigen, POC: NEGATIVE

## 2024-09-11 MED ORDER — PROMETHAZINE-DM 6.25-15 MG/5ML PO SYRP: 5.0000 mL | ORAL_SOLUTION | Freq: Four times a day (QID) | ORAL | 0 refills | Status: AC | PRN

## 2024-09-11 MED ORDER — AMOXICILLIN-POT CLAVULANATE 875-125 MG PO TABS: 1.0000 | ORAL_TABLET | Freq: Two times a day (BID) | ORAL | 0 refills | Status: AC

## 2024-09-11 NOTE — Progress Notes (Signed)
 Date:  09/11/2024   Name:  Diane Vasquez   DOB:  1956-07-07   MRN:  [id number]   Chief Complaint: Cough (Cough- bloody, yellow mucous. Sinus pressure and pain. Headache. SOB slightly. Throat pain. )  Cough This is a new problem. Associated symptoms include chills, headaches, nasal congestion, a sore throat and shortness of breath. Pertinent negatives include no chest pain, fever or wheezing.    Review of Systems  Constitutional:  Positive for chills and fatigue. Negative for fever.  HENT:  Positive for congestion, sinus pressure and sore throat.   Respiratory:  Positive for cough and shortness of breath. Negative for chest tightness and wheezing.   Cardiovascular:  Negative for chest pain and palpitations.  Gastrointestinal:  Negative for abdominal pain, nausea and vomiting.  Neurological:  Positive for headaches.  Psychiatric/Behavioral:  Positive for sleep disturbance. Negative for dysphoric mood. The patient is not nervous/anxious.      Lab Results  Component Value Date   NA 140 05/03/2024   K 4.7 05/03/2024   CO2 21 05/03/2024   GLUCOSE 97 05/03/2024   BUN 14 05/03/2024   CREATININE 0.68 05/03/2024   CALCIUM 9.2 05/03/2024   EGFR 97 05/03/2024   GFRNONAA 76 04/09/2020   Lab Results  Component Value Date   CHOL 185 05/03/2024   HDL 37 (L) 05/03/2024   LDLCALC 129 (H) 05/03/2024   TRIG 104 05/03/2024   CHOLHDL 5.0 (H) 05/03/2024   Lab Results  Component Value Date   TSH 2.010 05/03/2024   Lab Results  Component Value Date   HGBA1C 5.2 04/22/2022   Lab Results  Component Value Date   WBC 4.6 05/03/2024   HGB 12.9 05/03/2024   HCT 41.0 05/03/2024   MCV 99 (H) 05/03/2024   PLT 260 05/03/2024   Lab Results  Component Value Date   ALT 21 05/03/2024   AST 25 05/03/2024   ALKPHOS 90 05/03/2024   BILITOT 0.9 05/03/2024   Lab Results  Component Value Date   VD25OH 39.7 05/01/2023     Patient Active Problem List   Diagnosis Date Noted   Class 2  obesity without serious comorbidity with body mass index (BMI) of 39.0 to 39.9 in adult 05/02/2024   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/01/2023   Status post hysterectomy 12/02/2020   Squamous cell skin cancer 06/09/2020   Vitamin D  deficiency 04/03/2018   Hx of hematuria 10/01/2017   Obesity (BMI 30-39.9) 08/04/2017   Parathyroid  adenoma 09/19/2016   History of multiple pulmonary nodules 03/17/2016   Chronic fatigue, unspecified 02/16/2016   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 No cervix or uterus.   BREAST CYST ASPIRATION Right    neg   COLONOSCOPY  11/02/2009   normal - repeat 10 yrs   CTR     PARATHYROIDECTOMY Right 09/19/2016   Procedure: EXCISION PARATHYROID  ADENOMA;  Surgeon: Deward Argue, MD;  Location: [facility] ORS;  Service: ENT;  Laterality: Right;   SINUS SURGERY WITH INSTATRAK     sqamous cell removal Right 02/12/2019   Dr. Arlyss. Surgery  X 2    Social History   Tobacco Use   Smoking status: Never   Smokeless tobacco: Never  Vaping Use   Vaping status: Never Used  Substance Use Topics   Alcohol use: Yes    Comment: occ   Drug use: No     Medication list has been reviewed and updated.  Current Meds  Medication Sig   amoxicillin -clavulanate (AUGMENTIN ) 875-125 MG tablet Take 1 tablet by mouth 2 (two) times daily for 10 days.   Ascorbic Acid (VITAMIN C) 1000 MG tablet Take 1,000 mg by mouth daily.   cetirizine (ZYRTEC) 10 MG tablet Take 10 mg by mouth daily.   clindamycin (CLEOCIN T) 1 % external solution Apply topically daily.   clobetasol (TEMOVATE) 0.05 % external  solution clobetasol 0.05 % scalp solution  1(ONE) APPLICATION(S) SCALP EVERY DAY AS NEEDED FOR FLARES   dapsone 100 MG tablet Take 50 mg by mouth daily.    famotidine  (PEPCID ) 20 MG tablet TAKE 1 TABLET(20 MG) BY MOUTH TWICE DAILY   metroNIDAZOLE (METROGEL) 1 % gel Apply 1 application topically daily.    promethazine -dextromethorphan (PROMETHAZINE -DM) 6.25-15 MG/5ML syrup Take 5 mLs by mouth 4 (four) times daily as needed for up to 9 days for cough.   pseudoephedrine-guaifenesin (MUCINEX D) 60-600 MG 12 hr tablet Take 1 tablet by mouth every 12 (twelve) hours.   tretinoin (RETIN-A) 0.05 % cream Apply topically at bedtime.   Vitamin D , Ergocalciferol , (DRISDOL) 1.25 MG (50000 UNIT) CAPS capsule TAKE 1 CAPSULE BY MOUTH ONE TIME PER WEEK       09/11/2024   10:19 AM 05/02/2024    8:09 AM 06/06/2023    9:22 AM 05/01/2023    8:07 AM  GAD 7 : Generalized Anxiety Score  Nervous, Anxious, on Edge 0 0 0 0  Control/stop worrying 0 0 0 0  Worry too much - different things 0 0 0 0  Trouble relaxing 0 0 0 0  Restless 0 0 0 0  Easily annoyed or irritable 0 0 0 0  Afraid - awful might happen 0 0 0 0  Total GAD 7 Score 0 0 0 0  Anxiety Difficulty Not difficult at all Not difficult at all Not difficult at all Not difficult at all       09/11/2024   10:19 AM 05/02/2024    8:09 AM 06/06/2023    9:22 AM  Depression screen PHQ 2/9  Decreased Interest 0 0 0  Down, Depressed, Hopeless 0 0 0  PHQ - 2 Score 0 0 0  Altered sleeping 0 0 0  Tired, decreased energy 0 0 0  Change in appetite 0 0 0  Feeling bad or failure about yourself  0 0 0  Trouble concentrating 0 0 0  Moving slowly or fidgety/restless 0 0 0  Suicidal thoughts 0 0 0  PHQ-9 Score 0 0  0   Difficult doing work/chores Not difficult at all Not difficult at all Not difficult at all     Data saved with a previous flowsheet row definition    BP Readings from Last 3 Encounters:  09/11/24 122/74  05/02/24 128/70  06/06/23 104/60     Physical Exam Vitals and nursing note reviewed.  Constitutional:      General: She is not in acute distress.    Appearance: She is well-developed. She is ill-appearing.  HENT:     Head: Normocephalic and atraumatic.     Right Ear: Tympanic membrane normal.  Left Ear: Tympanic membrane normal.     Nose:     Right Sinus: Maxillary sinus tenderness and frontal sinus tenderness present.     Left Sinus: Maxillary sinus tenderness and frontal sinus tenderness present.     Mouth/Throat:     Pharynx: Posterior oropharyngeal erythema and postnasal drip present. No uvula swelling.  Cardiovascular:     Rate and Rhythm: Normal rate and regular rhythm.  Pulmonary:     Effort: Pulmonary effort is normal. No respiratory distress.     Breath sounds: No wheezing or rhonchi.  Musculoskeletal:     Cervical back: Normal range of motion.  Lymphadenopathy:     Cervical: No cervical adenopathy.  Skin:    General: Skin is warm and dry.     Findings: No rash.  Neurological:     Mental Status: She is alert and oriented to person, place, and time.  Psychiatric:        Mood and Affect: Mood normal.        Behavior: Behavior normal.     Wt Readings from Last 3 Encounters:  09/11/24 221 lb (100.2 kg)  05/02/24 228 lb (103.4 kg)  06/06/23 229 lb (103.9 kg)    BP 122/74   Pulse 87   Temp 98.2 F (36.8 C) (Oral)   Ht 5' 4 (1.626 m)   Wt 221 lb (100.2 kg)   SpO2 95%   BMI 37.93 kg/m   Assessment and Plan:  Problem List Items Addressed This Visit   None Visit Diagnoses       Acute non-recurrent maxillary sinusitis    -  Primary   continue Mucinex-D and fluids resume Zyrtec daily   Relevant Medications   promethazine -dextromethorphan (PROMETHAZINE -DM) 6.25-15 MG/5ML syrup   amoxicillin -clavulanate (AUGMENTIN ) 875-125 MG tablet     Cough at rest       Relevant Orders   POC Covid19/Flu A&B Antigen (Completed)       Return in about 8 months (around 05/11/2025) for CPX - Dr.  Lemon.    Diane HILARIO Adie, MD [facility] [place]

## 2024-09-11 NOTE — Telephone Encounter (Signed)
 Noted  Pt has appt.  KP

## 2024-09-11 NOTE — Telephone Encounter (Signed)
 FYI Only or Action Required?: FYI only for provider: appointment scheduled on 09/11/2024.  Patient was last seen in primary care on 05/02/2024 by Justus Leita DEL, MD.  Called Nurse Triage reporting Cough.  Symptoms began Sunday.  Interventions attempted: OTC medications: muccinex-D.  Symptoms are: gradually worsening.  Triage Disposition: See Physician Within 24 Hours  Patient/caregiver understands and will follow disposition?: Yes  Copied from CRM #[id number]. Topic: Clinical - Red Word Triage >> Sep 11, 2024  8:12 AM Diane Vasquez wrote: [facility] that prompted transfer to Nurse Triage: Pt calling states she thinks she needs an antibitoic for siunus infection.  Symtomps include productive cough, with colored mucous, and drainage down back of throat, sore throat, and increased nasal congestion, causing difficulty breathing.  Pt reports she would like to be seen for evaluation. Reason for Disposition  SEVERE coughing spells (e.g., whooping sound after coughing, vomiting after coughing)  Answer Assessment - Initial Assessment Questions 1. ONSET: When did the cough begin?      Sunday 2. SEVERITY: How bad is the cough today?      moderate 3. SPUTUM: Describe the color of your sputum (e.g., none, dry cough; clear, white, yellow, green)     yellow 4. HEMOPTYSIS: Are you coughing up any blood? If Yes, ask: How much? (e.g., flecks, streaks, tablespoons, etc.)     yes 5. DIFFICULTY BREATHING: Are you having difficulty breathing? If Yes, ask: How bad is it? (e.g., mild, moderate, severe)      SOB at times 6. FEVER: Do you have a fever? If Yes, ask: What is your temperature, how was it measured, and when did it start?     no 7. CARDIAC HISTORY: Do you have any history of heart disease? (e.g., heart attack, congestive heart failure)      no 8. LUNG HISTORY: Do you have any history of lung disease?  (e.g., pulmonary embolus, asthma, emphysema)     no 9. PE RISK FACTORS:  Do you have a history of blood clots? (or: recent major surgery, recent prolonged travel, bedridden)     na 10. OTHER SYMPTOMS: Do you have any other symptoms? (e.g., runny nose, wheezing, chest pain)       Sore throat, post nasal drip, nasal congestion,  11. PREGNANCY: Is there any chance you are pregnant? When was your last menstrual period?       na 12. TRAVEL: Have you traveled out of the country in the last month? (e.g., travel history, exposures)       Na  Muccinex -D, and has history of sinus infection infections in the past  Protocols used: Cough - Acute Productive-A-AH

## 2024-09-23 ENCOUNTER — Other Ambulatory Visit: Payer: Self-pay | Admitting: Internal Medicine

## 2024-09-23 ENCOUNTER — Telehealth: Payer: Self-pay

## 2024-09-23 DIAGNOSIS — B3731 Acute candidiasis of vulva and vagina: Secondary | ICD-10-CM

## 2024-09-23 MED ORDER — FLUCONAZOLE 100 MG PO TABS: 100.0000 mg | ORAL_TABLET | Freq: Once | ORAL | 0 refills | Status: AC

## 2024-09-23 NOTE — Progress Notes (Unsigned)
 Date:  09/23/2024   Name:  Diane Vasquez   DOB:  1956/04/14   MRN:  [id number]   Chief Complaint: No chief complaint on file.  HPI  Review of Systems   Lab Results  Component Value Date   NA 140 05/03/2024   K 4.7 05/03/2024   CO2 21 05/03/2024   GLUCOSE 97 05/03/2024   BUN 14 05/03/2024   CREATININE 0.68 05/03/2024   CALCIUM 9.2 05/03/2024   EGFR 97 05/03/2024   GFRNONAA 76 04/09/2020   Lab Results  Component Value Date   CHOL 185 05/03/2024   HDL 37 (L) 05/03/2024   LDLCALC 129 (H) 05/03/2024   TRIG 104 05/03/2024   CHOLHDL 5.0 (H) 05/03/2024   Lab Results  Component Value Date   TSH 2.010 05/03/2024   Lab Results  Component Value Date   HGBA1C 5.2 04/22/2022   Lab Results  Component Value Date   WBC 4.6 05/03/2024   HGB 12.9 05/03/2024   HCT 41.0 05/03/2024   MCV 99 (H) 05/03/2024   PLT 260 05/03/2024   Lab Results  Component Value Date   ALT 21 05/03/2024   AST 25 05/03/2024   ALKPHOS 90 05/03/2024   BILITOT 0.9 05/03/2024   Lab Results  Component Value Date   VD25OH 39.7 05/01/2023     Patient Active Problem List   Diagnosis Date Noted   Class 2 obesity without serious comorbidity with body mass index (BMI) of 39.0 to 39.9 in adult 05/02/2024   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/01/2023   Status post hysterectomy 12/02/2020   Squamous cell skin cancer 06/09/2020   Vitamin D  deficiency 04/03/2018   Hx of hematuria 10/01/2017   Obesity (BMI 30-39.9) 08/04/2017   Parathyroid  adenoma 09/19/2016   History of multiple pulmonary nodules 03/17/2016   Chronic fatigue, unspecified 02/16/2016   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 No cervix or uterus.   BREAST CYST ASPIRATION Right    neg   COLONOSCOPY  11/02/2009   normal - repeat 10 yrs   CTR     PARATHYROIDECTOMY Right 09/19/2016   Procedure: EXCISION PARATHYROID  ADENOMA;  Surgeon: Deward Argue, MD;  Location: [facility] ORS;  Service: ENT;  Laterality: Right;   SINUS SURGERY WITH INSTATRAK     sqamous cell removal Right 02/12/2019   Dr. Arlyss. Surgery X 2    Social History   Tobacco Use   Smoking status: Never   Smokeless tobacco: Never  Vaping Use   Vaping status: Never Used  Substance Use Topics   Alcohol use: Yes    Comment: occ   Drug use: No     Medication list has been reviewed and updated.  No outpatient medications have been marked as taking for the 09/23/24 encounter (Orders Only) with Justus Leita DEL, MD.       09/11/2024   10:19 AM 05/02/2024    8:09 AM 06/06/2023    9:22 AM 05/01/2023    8:07 AM  GAD 7 : Generalized Anxiety Score  Nervous, Anxious, on Edge 0 0 0  0  Control/stop worrying 0 0 0 0  Worry too much - different things 0 0 0 0  Trouble relaxing 0 0 0 0  Restless 0 0 0 0  Easily annoyed or irritable 0 0 0 0  Afraid - awful might happen 0 0 0 0  Total GAD 7 Score 0 0 0 0  Anxiety Difficulty Not difficult at all Not difficult at all Not difficult at all Not difficult at all       09/11/2024   10:19 AM 05/02/2024    8:09 AM 06/06/2023    9:22 AM  Depression screen PHQ 2/9  Decreased Interest 0 0 0  Down, Depressed, Hopeless 0 0 0  PHQ - 2 Score 0 0 0  Altered sleeping 0 0 0  Tired, decreased energy 0 0 0  Change in appetite 0 0 0  Feeling bad or failure about yourself  0 0 0  Trouble concentrating 0 0 0  Moving slowly or fidgety/restless 0 0 0  Suicidal thoughts 0 0 0  PHQ-9 Score 0 0  0   Difficult doing work/chores Not difficult at all Not difficult at all Not difficult at all     Data saved with a previous flowsheet row definition     BP Readings from Last 3 Encounters:  09/11/24 122/74  05/02/24 128/70  06/06/23 104/60    Physical Exam  Wt Readings from Last 3 Encounters:  09/11/24 221 lb (100.2 kg)  05/02/24 228 lb (103.4 kg)  06/06/23 229 lb (103.9 kg)    There were no vitals taken for this visit.  Assessment and Plan:  Problem List Items Addressed This Visit   None   No follow-ups on file.    Leita HILARIO Adie, MD [facility] [place]

## 2024-09-23 NOTE — Telephone Encounter (Signed)
 Copied from CRM #[id number]. Topic: Appointments - Appointment Scheduling >> Sep 23, 2024 10:59 AM Emylou G wrote: Yeast infection after she finished antibiotics - wants to know if she can get something called in instead of coming in?  Pls call advise. - she does use [facility]

## 2024-10-10 DIAGNOSIS — K080 Exfoliation of teeth due to systemic causes: Secondary | ICD-10-CM | POA: Diagnosis not present

## 2024-11-20 ENCOUNTER — Other Ambulatory Visit: Payer: Self-pay

## 2024-11-20 DIAGNOSIS — K219 Gastro-esophageal reflux disease without esophagitis: Secondary | ICD-10-CM

## 2024-11-20 MED ORDER — FAMOTIDINE 20 MG PO TABS: 20.0000 mg | ORAL_TABLET | Freq: Two times a day (BID) | ORAL | 0 refills | Status: AC

## 2025-05-14 ENCOUNTER — Encounter: Admitting: Student
